# Patient Record
Sex: Male | Born: 1995 | Race: Black or African American | Hispanic: No | Marital: Single | Smoking: Never smoker
Health system: Southern US, Community
[De-identification: ages and names within clinical notes are randomized; demographics above are authoritative.]

## PROBLEM LIST (undated history)

## (undated) DIAGNOSIS — L309 Dermatitis, unspecified: Secondary | ICD-10-CM

## (undated) DIAGNOSIS — J45909 Unspecified asthma, uncomplicated: Secondary | ICD-10-CM

## (undated) DIAGNOSIS — J302 Other seasonal allergic rhinitis: Secondary | ICD-10-CM

## (undated) HISTORY — DX: Unspecified asthma, uncomplicated: J45.909

## (undated) HISTORY — PX: NO PAST SURGERIES: SHX2092

## (undated) HISTORY — DX: Dermatitis, unspecified: L30.9

---
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
## 2016-06-19 ENCOUNTER — Emergency Department (HOSPITAL_COMMUNITY)
Admission: EM | Admit: 2016-06-19 | Discharge: 2016-06-19 | Disposition: A | Discharge status: Home / Self Care | Payer: BLUE CROSS/BLUE SHIELD | Attending: Emergency Medicine | Admitting: Emergency Medicine

## 2016-06-19 ENCOUNTER — Encounter (HOSPITAL_COMMUNITY): Payer: Self-pay | Admitting: Emergency Medicine

## 2016-06-19 DIAGNOSIS — J039 Acute tonsillitis, unspecified: Secondary | ICD-10-CM | POA: Diagnosis not present

## 2016-06-19 DIAGNOSIS — J45909 Unspecified asthma, uncomplicated: Secondary | ICD-10-CM | POA: Diagnosis not present

## 2016-06-19 DIAGNOSIS — J029 Acute pharyngitis, unspecified: Secondary | ICD-10-CM | POA: Diagnosis present

## 2016-06-19 MED ORDER — AMOXICILLIN 500 MG PO CAPS: 500.0000 mg | ORAL_CAPSULE | Freq: Three times a day (TID) | ORAL | 0 refills | Status: DC

## 2016-06-19 NOTE — ED Triage Notes (Signed)
Patient c/o sore throat that started on Thursday and is progressively getting worse. Per patient had some abd pain and vomited x1 on Friday but denies any since. Denies any fevers or coughing. Per patient using cough drops to help with pain but no relief.

## 2016-06-19 NOTE — Discharge Instructions (Signed)
Return if any problems. See Dr. Gerda DissLuking for recheck if symptoms worsen or change.

## 2016-06-19 NOTE — ED Provider Notes (Signed)
[facility] Provider Note   CSN: [id number] Arrival date & time: 06/19/16  1538     History   Chief Complaint Chief Complaint  Patient presents with  . Sore Throat    HPI Scott PollockJohn H Orsak III is a 21 y.o. male.  The history is provided by the patient. No language interpreter was used.  Sore Throat  This is a new problem. The current episode started more than 1 week ago. The problem occurs constantly. The problem has been gradually worsening. Nothing aggravates the symptoms. Nothing relieves the symptoms. He has tried nothing for the symptoms. The treatment provided no relief.  Pt complains of a sore throat.  Pt reports tonsils are enlarged   Past Medical History:  Diagnosis Date  . Asthma   . Eczema   . Seasonal allergies     Patient Active Problem List   Diagnosis Date Noted  . Asthma 06/09/2014  . Allergic rhinitis 10/02/2012    History reviewed. No pertinent surgical history.     Home Medications    Prior to Admission medications   Medication Sig Start Date End Date Taking? Authorizing Provider  albuterol (PROVENTIL HFA;VENTOLIN HFA) 108 (90 BASE) MCG/ACT inhaler Inhale 2 puffs into the lungs every 6 (six) hours as needed for wheezing. 12/09/13   Babs SciaraLuking, Scott A, MD  albuterol (PROVENTIL) (2.5 MG/3ML) 0.083% nebulizer solution Take 3 mLs (2.5 mg total) by nebulization every 4 (four) hours as needed for wheezing. 12/09/13   Babs SciaraLuking, Scott A, MD  amoxicillin (AMOXIL) 500 MG capsule Take 1 capsule (500 mg total) by mouth 3 (three) times daily. 06/19/16   Elson AreasSofia, Leslie K, PA-C  azithromycin (ZITHROMAX Z-PAK) 250 MG tablet Take 2 tablets (500 mg) on  Day 1,  followed by 1 tablet (250 mg) once daily on Days 2 through 5. 07/06/15   Campbell RichesHoskins, Carolyn C, NP  cetirizine (ZYRTEC) 10 MG tablet Take 1 tablet (10 mg total) by mouth daily. 12/20/13   Babs SciaraLuking, Scott A, MD  fluticasone Aleda Grana(FLONASE) 50 MCG/ACT nasal spray instill 2 sprays into each nostril once daily 03/09/15   Campbell RichesHoskins,  Carolyn C, NP    Family History History reviewed. No pertinent family history.  Social History Social History  Substance Use Topics  . Smoking status: Never Smoker  . Smokeless tobacco: Never Used  . Alcohol use No     Allergies   Patient has no known allergies.   Review of Systems Review of Systems  All other systems reviewed and are negative.    Physical Exam Updated Vital Signs BP (!) 147/83 (BP Location: Right Arm)   Pulse 96   Temp 99.3 F (37.4 C) (Oral)   Resp 18   Ht 5\' 10"  (1.778 m)   Wt 85.7 kg (189 lb)   SpO2 100%   BMI 27.12 kg/m   Physical Exam  Constitutional: He appears well-developed and well-nourished.  HENT:  Head: Normocephalic and atraumatic.  Nose: Nose normal.  Enlarged tonsils, erythema throat,   Eyes: Conjunctivae are normal.  Neck: Neck supple.  Cardiovascular: Normal rate and regular rhythm.   No murmur heard. Pulmonary/Chest: Effort normal and breath sounds normal. No respiratory distress.  Abdominal: Soft. There is no tenderness.  Musculoskeletal: Normal range of motion. He exhibits no edema.  Neurological: He is alert.  Skin: Skin is warm and dry.  Psychiatric: He has a normal mood and affect.  Nursing note and vitals reviewed.    ED Treatments / Results  Labs (all labs ordered are  listed, but only abnormal results are displayed) Labs Reviewed - No data to display  EKG  EKG Interpretation None       Radiology No results found.  Procedures Procedures (including critical care time)  Medications Ordered in ED Medications - No data to display   Initial Impression / Assessment and Plan / ED Course  I have reviewed the triage vital signs and the nursing notes.  Pertinent labs & imaging results that were available during my care of the patient were reviewed by me and considered in my medical decision making (see chart for details).       Final Clinical Impressions(s) / ED Diagnoses   Final diagnoses:    Tonsillitis    New Prescriptions New Prescriptions   AMOXICILLIN (AMOXIL) 500 MG CAPSULE    Take 1 capsule (500 mg total) by mouth 3 (three) times daily.  An After Visit Summary was printed and given to the patient.   Elson Areas, [place] 06/19/16 1740    Eber Hong, MD 06/21/16 1004

## 2016-06-20 ENCOUNTER — Encounter: Payer: Self-pay | Admitting: Family Medicine

## 2016-06-20 ENCOUNTER — Ambulatory Visit (INDEPENDENT_AMBULATORY_CARE_PROVIDER_SITE_OTHER): Payer: BLUE CROSS/BLUE SHIELD | Admitting: Family Medicine

## 2016-06-20 VITALS — BP 120/84 | Temp 99.0°F | Ht 70.75 in | Wt 197.0 lb

## 2016-06-20 DIAGNOSIS — I889 Nonspecific lymphadenitis, unspecified: Secondary | ICD-10-CM

## 2016-06-20 DIAGNOSIS — J329 Chronic sinusitis, unspecified: Secondary | ICD-10-CM | POA: Diagnosis not present

## 2016-06-20 DIAGNOSIS — J029 Acute pharyngitis, unspecified: Secondary | ICD-10-CM | POA: Diagnosis not present

## 2016-06-20 NOTE — Progress Notes (Signed)
   Subjective:    Patient ID: Scott Lam, male    DOB: 03-Dec-1995, 21 y.o.   MRN: [id number]  Dizziness  This is a new problem. The current episode started in the past 7 days. Associated symptoms include coughing and a sore throat. Associated symptoms comments: Runny nose . Treatments tried: Amoxicillin    Patient seen at ER yesterday and was diagnosed with Tonsilitis  Last wk glands of throat swolen and inflammed  Then vom and felt bad  Felt bad diminished enrgy  Tonsillitis Gland now a little better  somehat better  Missed work    Review of Systems  HENT: Positive for sore throat.   Respiratory: Positive for cough.   Neurological: Positive for dizziness.       Objective:   Physical Exam  Alert, mild malaise. Hydration good Vitals stable. frontal/ maxillary tenderness evident positive nasal congestion. pharynx normal neck supple  lungs clear/no crackles or wheezes. heart regular in rhythm       Assessment & Plan:  Impression rhinosinusitis likely post viral, discussed with patient. plan antibiotics prescribed. Questions answered. Symptomatic care discussed. warning signs discussed. WSL

## 2016-10-10 ENCOUNTER — Encounter: Payer: Self-pay | Admitting: Family Medicine

## 2016-10-10 ENCOUNTER — Ambulatory Visit (INDEPENDENT_AMBULATORY_CARE_PROVIDER_SITE_OTHER): Payer: BLUE CROSS/BLUE SHIELD | Admitting: Family Medicine

## 2016-10-10 VITALS — BP 114/74 | Temp 99.0°F | Wt 199.2 lb

## 2016-10-10 DIAGNOSIS — J029 Acute pharyngitis, unspecified: Secondary | ICD-10-CM | POA: Diagnosis not present

## 2016-10-10 DIAGNOSIS — I889 Nonspecific lymphadenitis, unspecified: Secondary | ICD-10-CM | POA: Diagnosis not present

## 2016-10-10 LAB — POCT RAPID STREP A (OFFICE): RAPID STREP A SCREEN: NEGATIVE

## 2016-10-10 MED ORDER — AZITHROMYCIN 250 MG PO TABS: ORAL_TABLET | ORAL | 0 refills | Status: DC

## 2016-10-10 NOTE — Progress Notes (Signed)
   Subjective:    Patient ID: Scott Lam, male    DOB: 1995/10/05, 21 y.o.   MRN: [id number]  Sore Throat   This is a new problem. The current episode started in the past 7 days. The maximum temperature recorded prior to his arrival was 100.4 - 100.9 F. Associated symptoms include headaches. Pertinent negatives include no congestion, coughing or ear pain. Treatments tried: Aleve    Significant sore throat for several days inflammation enlargement of tonsils with exudate in addition the medicine fever headache fatigue tiredness some chills no wheezing a little bit of runny nose but no significant congestion or cough Patient states no other concerns this visit.  Results for orders placed or performed in visit on 10/10/16  POCT rapid strep A  Result Value Ref Range   Rapid Strep A Screen Negative Negative    Review of Systems  Constitutional: Negative for activity change and fever.  HENT: Positive for sore throat. Negative for congestion, ear pain and rhinorrhea.   Eyes: Negative for discharge.  Respiratory: Negative for cough and wheezing.   Cardiovascular: Negative for chest pain.  Neurological: Positive for headaches.       Objective:   Physical Exam  Constitutional: He appears well-developed.  HENT:  Head: Normocephalic.  Mouth/Throat: Oropharyngeal exudate present.  Neck: Normal range of motion.  Cardiovascular: Normal rate, regular rhythm and normal heart sounds.   No murmur heard. Pulmonary/Chest: Effort normal and breath sounds normal. He has no wheezes.  Lymphadenopathy:    He has no cervical adenopathy.  Neurological: He exhibits normal muscle tone.  Skin: Skin is warm and dry.  Nursing note and vitals reviewed.  Significant erythema of both tonsils with enlargement but no sign of abscess Rapid strep negative      Assessment & Plan:  Significant pharyngitis Strep test negative Could be mycoplasma could be viral Z-Pak recommended Warning signs were  discussed If not getting better over the next several days notify us we may need to do lab work to look for mono No sign of abscess

## 2016-10-11 LAB — STREP A DNA PROBE: Strep Gp A Direct, DNA Probe: NEGATIVE

## 2018-02-20 ENCOUNTER — Ambulatory Visit: Payer: Self-pay | Admitting: Family Medicine

## 2018-02-20 ENCOUNTER — Encounter: Payer: Self-pay | Admitting: Family Medicine

## 2018-02-20 VITALS — BP 128/74 | Temp 98.5°F | Ht 70.75 in | Wt 199.4 lb

## 2018-02-20 DIAGNOSIS — H6123 Impacted cerumen, bilateral: Secondary | ICD-10-CM

## 2018-02-20 NOTE — Progress Notes (Signed)
Subjective:    Patient ID: Scott Lam, male    DOB: 12-Aug-1995, 23 y.o.   MRN: [id number]  HPI Ears stopped up for about a year. Trouble hearing for about one week. Tried an otc ear wax removal kit, was able to get some wax out but feels like it's getting worse over the last 2-3 days. Works around [employer], wears ear plugs at work.   No recent URI symptoms.   Review of Systems  Constitutional: Negative for fever.  HENT: Negative for congestion, rhinorrhea, sinus pressure and sore throat.   Eyes: Negative for discharge.  Respiratory: Negative for cough, shortness of breath and wheezing.        Objective:   Physical Exam Vitals signs and nursing note reviewed.  Constitutional:      General: He is not in acute distress.    Appearance: Normal appearance. He is not toxic-appearing.  HENT:     Head: Normocephalic and atraumatic.     Right Ear: There is impacted cerumen.     Left Ear: There is impacted cerumen.     Nose: Nose normal.     Mouth/Throat:     Mouth: Mucous membranes are moist.     Pharynx: Oropharynx is clear.  Eyes:     General:        Right eye: No discharge.        Left eye: No discharge.  Neck:     Musculoskeletal: Neck supple. No neck rigidity.  Cardiovascular:     Rate and Rhythm: Normal rate and regular rhythm.     Heart sounds: Normal heart sounds.  Pulmonary:     Effort: Pulmonary effort is normal. No respiratory distress.     Breath sounds: Normal breath sounds.  Lymphadenopathy:     Cervical: No cervical adenopathy.  Skin:    General: Skin is warm and dry.  Neurological:     Mental Status: He is alert and oriented to person, place, and time.  Psychiatric:        Mood and Affect: Mood normal.        Assessment & Plan:  Bilateral impacted cerumen - Plan: Ambulatory referral to ENT  Recommend referral to ENT for bilateral cerumen removal. Also recommended scheduling a wellness exam with Dr. Nicki Reaper at pt's earliest convenience as  we've not seen him for wellness is quite some time.

## 2018-02-28 ENCOUNTER — Encounter: Payer: Self-pay | Admitting: Family Medicine

## 2018-04-05 ENCOUNTER — Ambulatory Visit (INDEPENDENT_AMBULATORY_CARE_PROVIDER_SITE_OTHER): Payer: BLUE CROSS/BLUE SHIELD | Admitting: Otolaryngology

## 2018-04-05 DIAGNOSIS — H6123 Impacted cerumen, bilateral: Secondary | ICD-10-CM

## 2018-04-05 DIAGNOSIS — H900 Conductive hearing loss, bilateral: Secondary | ICD-10-CM

## 2018-11-30 ENCOUNTER — Other Ambulatory Visit: Payer: Self-pay

## 2018-11-30 DIAGNOSIS — Z20822 Contact with and (suspected) exposure to covid-19: Secondary | ICD-10-CM

## 2018-12-01 LAB — NOVEL CORONAVIRUS, NAA: SARS-CoV-2, NAA: NOT DETECTED

## 2018-12-06 ENCOUNTER — Telehealth: Payer: Self-pay | Admitting: *Deleted

## 2018-12-06 NOTE — Telephone Encounter (Signed)
Pt notified that his covid-19 test result is negative. He voiced understanding. He had been around a Art therapist that tested positive.

## 2019-02-16 ENCOUNTER — Ambulatory Visit (INDEPENDENT_AMBULATORY_CARE_PROVIDER_SITE_OTHER): Payer: Self-pay

## 2019-02-16 ENCOUNTER — Ambulatory Visit
Admission: EM | Admit: 2019-02-16 | Discharge: 2019-02-16 | Disposition: A | Discharge status: Home / Self Care | Payer: Self-pay | Attending: Emergency Medicine | Admitting: Emergency Medicine

## 2019-02-16 ENCOUNTER — Other Ambulatory Visit: Payer: Self-pay

## 2019-02-16 DIAGNOSIS — S6992XA Unspecified injury of left wrist, hand and finger(s), initial encounter: Secondary | ICD-10-CM

## 2019-02-16 DIAGNOSIS — M25532 Pain in left wrist: Secondary | ICD-10-CM

## 2019-02-16 NOTE — Discharge Instructions (Addendum)
Rest, ice and heat as needed Ensure adequate ROM as tolerated. Advised to get a wrist splint at the store Take OTC Tylenol ibuprofen for pain management Return here or go to ER if you have any new or worsening symptoms such as numbness/tingling of the inner thighs, loss of bladder or bowel control, headache/blurry vision, nausea/vomiting, confusion/altered mental status, dizziness, weakness, passing out, imbalance, etc..Marland Kitchen

## 2019-02-16 NOTE — ED Triage Notes (Signed)
Patient states that he was moving something heavy and felt something pop in his left wrist x 2 days ago. Pain along medial side. No bruising or swelling. FROM present in left wrist.

## 2019-02-16 NOTE — ED Provider Notes (Signed)
[facility]    CSN: [id number] Arrival date & time: 02/16/19  1232      History   Chief Complaint Chief Complaint  Patient presents with  . Wrist Injury    HPI Scott Lam is a 24 y.o. male.   Scott Lam 24 years old male present to the urgent care with a complaint of left wrist pain  for the past 2 days.  Reports he heard something pop in his wrist.  He localizes the pain to the left wrist.  He describes the pain as constant and achy and rated at 4 on a scale of 1-10.  Pain is worse on range of motion.  Report he has not tried any medication.  His symptoms are made worse with movement and lifting heavy objects.  He denies similar symptoms in the past.     The history is provided by the patient. No language interpreter was used.  Wrist Injury   Past Medical History:  Diagnosis Date  . Asthma   . Eczema   . Seasonal allergies     Patient Active Problem List   Diagnosis Date Noted  . Asthma 06/09/2014  . Allergic rhinitis 10/02/2012    History reviewed. No pertinent surgical history.     Home Medications    Prior to Admission medications   Not on File    Family History Family History  Problem Relation Age of Onset  . Healthy Mother   . Healthy Father     Social History Social History   Tobacco Use  . Smoking status: Never Smoker  . Smokeless tobacco: Never Used  Substance Use Topics  . Alcohol use: No  . Drug use: No     Allergies   Patient has no known allergies.   Review of Systems Review of Systems  Constitutional: Negative.   Respiratory: Negative.   Cardiovascular: Negative.   Musculoskeletal: Negative for joint swelling.       Wrist pain  Neurological: Negative.   All other systems reviewed and are negative.    Physical Exam Triage Vital Signs ED Triage Vitals  Enc Vitals Group     BP 02/16/19 1239 126/77     Pulse Rate 02/16/19 1239 62     Resp 02/16/19 1239 16     Temp 02/16/19 1239 98.4 F (36.9 C)     Temp Source 02/16/19 1239 Oral     SpO2 02/16/19 1239 99 %     Weight --      Height --      Head Circumference --      Peak Flow --      Pain Score 02/16/19 1244 6     Pain Loc --      Pain Edu? --      Excl. in [place]? --    No data found.  Updated Vital Signs BP 126/77 (BP Location: Right Arm)   Pulse 62   Temp 98.4 F (36.9 C) (Oral)   Resp 16   SpO2 99%   Visual Acuity Right Eye Distance:   Left Eye Distance:   Bilateral Distance:    Right Eye Near:   Left Eye Near:    Bilateral Near:     Physical Exam Vitals and nursing note reviewed.  Constitutional:      General: He is not in acute distress.    Appearance: Normal appearance. He is normal weight. He is not toxic-appearing.  Cardiovascular:     Rate and Rhythm:  Normal rate and regular rhythm.     Pulses: Normal pulses.     Heart sounds: Normal heart sounds. No murmur.  Pulmonary:     Effort: Pulmonary effort is normal. No respiratory distress.     Breath sounds: Normal breath sounds. No wheezing.  Chest:     Chest wall: No tenderness.  Musculoskeletal:        General: Tenderness present. No swelling, deformity or signs of injury. Normal range of motion.     Right wrist: Normal.     Left wrist: No swelling, deformity, effusion, lacerations or tenderness. Normal range of motion. Normal pulse.     Right lower leg: No edema.     Left lower leg: No edema.  Neurological:     General: No focal deficit present.     Mental Status: He is alert and oriented to person, place, and time.     Cranial Nerves: No cranial nerve deficit.     Sensory: No sensory deficit.     Motor: No weakness.     Coordination: Coordination normal.     Gait: Gait normal.     Deep Tendon Reflexes: Reflexes normal.      UC Treatments / Results  Labs (all labs ordered are listed, but only abnormal results are displayed) Labs Reviewed - No data to display  EKG   Radiology DG Wrist Complete Left  Result Date: 02/16/2019 CLINICAL  DATA:  Trauma 2 days ago.  Pain. EXAM: LEFT WRIST - COMPLETE 3+ VIEW COMPARISON:  None. FINDINGS: There is no evidence of fracture or dislocation. There is no evidence of arthropathy or other focal bone abnormality. Soft tissues are unremarkable. IMPRESSION: Negative. Electronically Signed   By: Gerome Sam Lam M.D   On: 02/16/2019 13:08    Procedures Procedures (including critical care time)  Medications Ordered in UC Medications - No data to display  Initial Impression / Assessment and Plan / UC Course  I have reviewed the triage vital signs and the nursing notes.  Pertinent labs & imaging results that were available during my care of the patient were reviewed by me and considered in my medical decision making (see chart for details).    Left wrist x-ray was was ordered and result was reviewed.  Result is negative for acute fracture or dislocation. Patient is stable for discharge.   Advised patient to follow up with primary care To return for worsening of symptoms  Final Clinical Impressions(s) / UC Diagnoses   Final diagnoses:  Left wrist pain     Discharge Instructions     Rest, ice and heat as needed Ensure adequate ROM as tolerated. Advised to get a wrist splint at the store Take OTC Tylenol ibuprofen for pain management Return here or go to ER if you have any new or worsening symptoms such as numbness/tingling of the inner thighs, loss of bladder or bowel control, headache/blurry vision, nausea/vomiting, confusion/altered mental status, dizziness, weakness, passing out, imbalance, etc...      ED Prescriptions    None     PDMP not reviewed this encounter.   Durward Parcel, FNP 02/16/19 1328

## 2019-09-25 ENCOUNTER — Ambulatory Visit
Admission: EM | Admit: 2019-09-25 | Discharge: 2019-09-25 | Disposition: A | Discharge status: Home / Self Care | Payer: Self-pay | Attending: Emergency Medicine | Admitting: Emergency Medicine

## 2019-09-25 ENCOUNTER — Encounter: Payer: Self-pay | Admitting: Emergency Medicine

## 2019-09-25 ENCOUNTER — Other Ambulatory Visit: Payer: Self-pay

## 2019-09-25 DIAGNOSIS — H6123 Impacted cerumen, bilateral: Secondary | ICD-10-CM

## 2019-09-25 MED ORDER — AMOXICILLIN 500 MG PO CAPS: 500.0000 mg | ORAL_CAPSULE | Freq: Two times a day (BID) | ORAL | 0 refills | Status: AC

## 2019-09-25 NOTE — ED Triage Notes (Signed)
Bilateral ears stopped up. Pt has hx of wax buildup

## 2019-09-25 NOTE — Discharge Instructions (Signed)
Wax removed Amoxicillin x 1 week Ibuprofen and tylenol as needed

## 2019-09-25 NOTE — ED Provider Notes (Signed)
[facility]    CSN: [id number] Arrival date & time: 09/25/19  1010      History   Chief Complaint Chief Complaint  Patient presents with  . Cerumen Impaction    HPI Scott Lam is a 24 y.o. male presenting today for evaluation of cerumen impaction.  Reports that has developed discomfort in bilateral ears.  Has history of wax buildup and feels similar.  Denies any associated pain.  Denies recent URI symptoms.  Applied some Debrox last night.  HPI  Past Medical History:  Diagnosis Date  . Asthma   . Eczema   . Seasonal allergies     Patient Active Problem List   Diagnosis Date Noted  . Asthma 06/09/2014  . Allergic rhinitis 10/02/2012    History reviewed. No pertinent surgical history.     Home Medications    Prior to Admission medications   Medication Sig Start Date End Date Taking? Authorizing Provider  amoxicillin (AMOXIL) 500 MG capsule Take 1 capsule (500 mg total) by mouth 2 (two) times daily for 7 days. 09/25/19 10/02/19  Deklyn Trachtenberg, Junius Creamer, PA-C    Family History Family History  Problem Relation Age of Onset  . Healthy Mother   . Healthy Father     Social History Social History   Tobacco Use  . Smoking status: Never Smoker  . Smokeless tobacco: Never Used  Vaping Use  . Vaping Use: Never used  Substance Use Topics  . Alcohol use: No  . Drug use: No     Allergies   Patient has no known allergies.   Review of Systems Review of Systems  Constitutional: Negative for activity change, appetite change, chills, fatigue and fever.  HENT: Positive for hearing loss. Negative for congestion, ear pain, rhinorrhea, sinus pressure, sore throat and trouble swallowing.   Eyes: Negative for discharge and redness.  Respiratory: Negative for cough, chest tightness and shortness of breath.   Cardiovascular: Negative for chest pain.  Gastrointestinal: Negative for abdominal pain, diarrhea, nausea and vomiting.  Musculoskeletal: Negative for  myalgias.  Skin: Negative for rash.  Neurological: Negative for dizziness, light-headedness and headaches.     Physical Exam Triage Vital Signs ED Triage Vitals  Enc Vitals Group     BP      Pulse      Resp      Temp      Temp src      SpO2      Weight      Height      Head Circumference      Peak Flow      Pain Score      Pain Loc      Pain Edu?      Excl. in [place]?    No data found.  Updated Vital Signs BP 121/75 (BP Location: Right Arm)   Pulse 62   Temp 98.7 F (37.1 C) (Oral)   Resp 17   Ht 5\' 11"  (1.803 m)   Wt 198 lb (89.8 kg)   SpO2 98%   BMI 27.62 kg/m   Visual Acuity Right Eye Distance:   Left Eye Distance:   Bilateral Distance:    Right Eye Near:   Left Eye Near:    Bilateral Near:     Physical Exam Vitals and nursing note reviewed.  Constitutional:      Appearance: He is well-developed.     Comments: No acute distress  HENT:     Head: Normocephalic  and atraumatic.     Ears:     Comments: bialteral cerumen impation    Nose: Nose normal.     Comments: Bilateral turbinates swollen and pink    Mouth/Throat:     Comments: Oral mucosa pink and moist, no tonsillar enlargement or exudate. Posterior pharynx patent and nonerythematous, no uvula deviation or swelling. Normal phonation. Eyes:     Conjunctiva/sclera: Conjunctivae normal.  Cardiovascular:     Rate and Rhythm: Normal rate.  Pulmonary:     Effort: Pulmonary effort is normal. No respiratory distress.  Abdominal:     General: There is no distension.  Musculoskeletal:        General: Normal range of motion.     Cervical back: Neck supple.  Skin:    General: Skin is warm and dry.  Neurological:     Mental Status: He is alert and oriented to person, place, and time.      UC Treatments / Results  Labs (all labs ordered are listed, but only abnormal results are displayed) Labs Reviewed - No data to display  EKG   Radiology No results found.  Procedures Procedures  (including critical care time)  Medications Ordered in UC Medications - No data to display  Initial Impression / Assessment and Plan / UC Course  I have reviewed the triage vital signs and the nursing notes.  Pertinent labs & imaging results that were available during my care of the patient were reviewed by me and considered in my medical decision making (see chart for details).     Ear irrigation by nursing staff performed.  Canals examined after removal and TMs intact, appears slightly dull, canals erythematous without swelling.  Providing amoxicillin for prevention of infection, Tylenol and ibuprofen for inflammation.  Debrox for prevention.  Discussed strict return precautions. Patient verbalized understanding and is agreeable with plan.   Final Clinical Impressions(s) / UC Diagnoses   Final diagnoses:  Bilateral impacted cerumen     Discharge Instructions     Wax removed Amoxicillin x 1 week Ibuprofen and tylenol as needed    ED Prescriptions    Medication Sig Dispense Auth. Provider   amoxicillin (AMOXIL) 500 MG capsule Take 1 capsule (500 mg total) by mouth 2 (two) times daily for 7 days. 14 capsule Alexea Blase, [place] C, PA-C     PDMP not reviewed this encounter.   Lew Dawes, [place] 09/25/19 1115

## 2020-05-28 ENCOUNTER — Ambulatory Visit (INDEPENDENT_AMBULATORY_CARE_PROVIDER_SITE_OTHER): Payer: BC Managed Care – PPO | Admitting: Family Medicine

## 2020-05-28 ENCOUNTER — Encounter: Payer: Self-pay | Admitting: Family Medicine

## 2020-05-28 ENCOUNTER — Other Ambulatory Visit: Payer: Self-pay

## 2020-05-28 VITALS — BP 140/82 | HR 86 | Temp 97.5°F | Wt 240.0 lb

## 2020-05-28 DIAGNOSIS — J029 Acute pharyngitis, unspecified: Secondary | ICD-10-CM | POA: Diagnosis not present

## 2020-05-28 DIAGNOSIS — J020 Streptococcal pharyngitis: Secondary | ICD-10-CM | POA: Diagnosis not present

## 2020-05-28 LAB — POCT RAPID STREP A (OFFICE): Rapid Strep A Screen: POSITIVE — AB

## 2020-05-28 MED ORDER — PENICILLIN V POTASSIUM 500 MG PO TABS: 500.0000 mg | ORAL_TABLET | Freq: Three times a day (TID) | ORAL | 0 refills | Status: AC

## 2020-05-28 NOTE — Patient Instructions (Addendum)
https://www.nhlbi.nih.gov/files/docs/public/heart/dash_brief.pdf">  DASH Eating Plan DASH stands for Dietary Approaches to Stop Hypertension. The DASH eating plan is a healthy eating plan that has been shown to:  Reduce high blood pressure (hypertension).  Reduce your risk for type 2 diabetes, heart disease, and stroke.  Help with weight loss. What are tips for following this plan? Reading food labels  Check food labels for the amount of salt (sodium) per serving. Choose foods with less than 5 percent of the Daily Value of sodium. Generally, foods with less than 300 milligrams (mg) of sodium per serving fit into this eating plan.  To find whole grains, look for the word "whole" as the first word in the ingredient list. Shopping  Buy products labeled as "low-sodium" or "no salt added."  Buy fresh foods. Avoid canned foods and pre-made or frozen meals. Cooking  Avoid adding salt when cooking. Use salt-free seasonings or herbs instead of table salt or sea salt. Check with your health care provider or pharmacist before using salt substitutes.  Do not fry foods. Cook foods using healthy methods such as baking, boiling, grilling, roasting, and broiling instead.  Cook with heart-healthy oils, such as olive, canola, avocado, soybean, or sunflower oil. Meal planning  Eat a balanced diet that includes: ? 4 or more servings of fruits and 4 or more servings of vegetables each day. Try to fill one-half of your plate with fruits and vegetables. ? 6-8 servings of whole grains each day. ? Less than 6 oz (170 g) of lean meat, poultry, or fish each day. A 3-oz (85-g) serving of meat is about the same size as a deck of cards. One egg equals 1 oz (28 g). ? 2-3 servings of low-fat dairy each day. One serving is 1 cup (237 mL). ? 1 serving of nuts, seeds, or beans 5 times each week. ? 2-3 servings of heart-healthy fats. Healthy fats called omega-3 fatty acids are found in foods such as walnuts,  flaxseeds, fortified milks, and eggs. These fats are also found in cold-water fish, such as sardines, salmon, and mackerel.  Limit how much you eat of: ? Canned or prepackaged foods. ? Food that is high in trans fat, such as some fried foods. ? Food that is high in saturated fat, such as fatty meat. ? Desserts and other sweets, sugary drinks, and other foods with added sugar. ? Full-fat dairy products.  Do not salt foods before eating.  Do not eat more than 4 egg yolks a week.  Try to eat at least 2 vegetarian meals a week.  Eat more home-cooked food and less restaurant, buffet, and fast food.   Lifestyle  When eating at a restaurant, ask that your food be prepared with less salt or no salt, if possible.  If you drink alcohol: ? Limit how much you use to:  0-1 drink a day for women who are not pregnant.  0-2 drinks a day for men. ? Be aware of how much alcohol is in your drink. In the [place] one drink equals one 12 oz bottle of beer (355 mL), one 5 oz glass of wine (148 mL), or one 1 oz glass of hard liquor (44 mL). General information  Avoid eating more than 2,300 mg of salt a day. If you have hypertension, you may need to reduce your sodium intake to 1,500 mg a day.  Work with your health care provider to maintain a healthy body weight or to lose weight. Ask what an ideal weight is for   you.  Get at least 30 minutes of exercise that causes your heart to beat faster (aerobic exercise) most days of the week. Activities may include walking, swimming, or biking.  Work with your health care provider or dietitian to adjust your eating plan to your individual calorie needs. What foods should I eat? Fruits All fresh, dried, or frozen fruit. Canned fruit in natural juice (without added sugar). Vegetables Fresh or frozen vegetables (raw, steamed, roasted, or grilled). Low-sodium or reduced-sodium tomato and vegetable juice. Low-sodium or reduced-sodium tomato sauce and tomato paste.  Low-sodium or reduced-sodium canned vegetables. Grains Whole-grain or whole-wheat bread. Whole-grain or whole-wheat pasta. Brown rice. Oatmeal. Quinoa. Bulgur. Whole-grain and low-sodium cereals. Pita bread. Low-fat, low-sodium crackers. Whole-wheat flour tortillas. Meats and other proteins Skinless chicken or turkey. Ground chicken or turkey. Pork with fat trimmed off. Fish and seafood. Egg whites. Dried beans, peas, or lentils. Unsalted nuts, nut butters, and seeds. Unsalted canned beans. Lean cuts of beef with fat trimmed off. Low-sodium, lean precooked or cured meat, such as sausages or meat loaves. Dairy Low-fat (1%) or fat-free (skim) milk. Reduced-fat, low-fat, or fat-free cheeses. Nonfat, low-sodium ricotta or cottage cheese. Low-fat or nonfat yogurt. Low-fat, low-sodium cheese. Fats and oils Soft margarine without trans fats. Vegetable oil. Reduced-fat, low-fat, or light mayonnaise and salad dressings (reduced-sodium). Canola, safflower, olive, avocado, soybean, and sunflower oils. Avocado. Seasonings and condiments Herbs. Spices. Seasoning mixes without salt. Other foods Unsalted popcorn and pretzels. Fat-free sweets. The items listed above may not be a complete list of foods and beverages you can eat. Contact a dietitian for more information. What foods should I avoid? Fruits Canned fruit in a light or heavy syrup. Fried fruit. Fruit in cream or butter sauce. Vegetables Creamed or fried vegetables. Vegetables in a cheese sauce. Regular canned vegetables (not low-sodium or reduced-sodium). Regular canned tomato sauce and paste (not low-sodium or reduced-sodium). Regular tomato and vegetable juice (not low-sodium or reduced-sodium). Pickles. Olives. Grains Baked goods made with fat, such as croissants, muffins, or some breads. Dry pasta or rice meal packs. Meats and other proteins Fatty cuts of meat. Ribs. Fried meat. Bacon. Bologna, salami, and other precooked or cured meats, such as  sausages or meat loaves. Fat from the back of a pig (fatback). Bratwurst. Salted nuts and seeds. Canned beans with added salt. Canned or smoked fish. Whole eggs or egg yolks. Chicken or turkey with skin. Dairy Whole or 2% milk, cream, and half-and-half. Whole or full-fat cream cheese. Whole-fat or sweetened yogurt. Full-fat cheese. Nondairy creamers. Whipped toppings. Processed cheese and cheese spreads. Fats and oils Butter. Stick margarine. Lard. Shortening. Ghee. Bacon fat. Tropical oils, such as coconut, palm kernel, or palm oil. Seasonings and condiments Onion salt, garlic salt, seasoned salt, table salt, and sea salt. Worcestershire sauce. Tartar sauce. Barbecue sauce. Teriyaki sauce. Soy sauce, including reduced-sodium. Steak sauce. Canned and packaged gravies. Fish sauce. Oyster sauce. Cocktail sauce. Store-bought horseradish. Ketchup. Mustard. Meat flavorings and tenderizers. Bouillon cubes. Hot sauces. Pre-made or packaged marinades. Pre-made or packaged taco seasonings. Relishes. Regular salad dressings. Other foods Salted popcorn and pretzels. The items listed above may not be a complete list of foods and beverages you should avoid. Contact a dietitian for more information. Where to find more information  National Heart, Lung, and Blood Institute: www.nhlbi.nih.gov  American Heart Association: www.heart.org  Academy of Nutrition and Dietetics: www.eatright.org  National Kidney Foundation: www.kidney.org Summary  The DASH eating plan is a healthy eating plan that has been shown to   reduce high blood pressure (hypertension). It may also reduce your risk for type 2 diabetes, heart disease, and stroke.  When on the DASH eating plan, aim to eat more fresh fruits and vegetables, whole grains, lean proteins, low-fat dairy, and heart-healthy fats.  With the DASH eating plan, you should limit salt (sodium) intake to 2,300 mg a day. If you have hypertension, you may need to reduce your  sodium intake to 1,500 mg a day.  Work with your health care provider or dietitian to adjust your eating plan to your individual calorie needs. This information is not intended to replace advice given to you by your health care provider. Make sure you discuss any questions you have with your health care provider. Document Revised: 12/07/2018 Document Reviewed: 12/07/2018 Elsevier Patient Education  2021 [employer]  Strep Throat, Adult Strep throat is an infection of the throat. It is caused by germs (bacteria). Strep throat is common during the cold months of the year. It mostly affects children who are 37-44 years old. However, people of all ages can get it at any time of the year. When strep throat affects the tonsils, it is called tonsillitis. When it affects the back of the throat, it is called pharyngitis. This infection spreads from person to person through coughing, sneezing, or having close contact. What are the causes? This condition is caused by the Streptococcus pyogenes germ. What increases the risk? You are more likely to develop this condition if:  You care for young children. Children are more likely to get strep throat and may spread it to others.  You go to crowded places. Germs can spread easily in such places.  You kiss or touch someone who has strep throat. What are the signs or symptoms? Symptoms of this condition include:  Fever or chills.  Redness, swelling, or pain in the tonsils or throat.  Pain or trouble when swallowing.  White or yellow spots on the tonsils or throat.  Tender glands in the neck and under the jaw.  Bad breath.  Red rash all over the body. This is rare. How is this treated? This condition may be treated with:  Medicines that kill germs (antibiotics).  Medicines that treat pain or fever. These include: ? Ibuprofen or acetaminophen. ? Aspirin, only for patients who are over the age of 7. ? Throat lozenges. ? Throat  sprays. Follow these instructions at home: Medicines  Take over-the-counter and prescription medicines only as told by your doctor.  Take your antibiotic medicine as told by your doctor. Do not stop taking the antibiotic even if you start to feel better.   Eating and drinking  If you have trouble swallowing, eat soft foods until your throat feels better.  Drink enough fluid to keep your pee (urine) pale yellow.  To help with pain, you may have: ? Warm fluids, such as soup and tea. ? Cold fluids, such as frozen desserts or popsicles.   General instructions  Rinse your mouth (gargle) with a salt-water mixture 3-4 times a day or as needed. To make a salt-water mixture, dissolve -1 tsp (3-6 g) of salt in 1 cup (237 mL) of warm water.  Rest as much as you can.  Stay home from work or school until you have been taking antibiotics for 24 hours.  Avoid smoking or being around people who smoke.  Keep all follow-up visits as told by your doctor. This is important. How is this prevented?  Do not share food, drinking cups,  or personal items. They can cause the germs to spread.  Wash your hands well with soap and water. Make sure that all people in your house wash their hands well.  Have family members tested if they have a fever or a sore throat. They may need an antibiotic if they have strep throat.   Contact a doctor if:  You have swelling in your neck that keeps getting bigger.  You get a rash, cough, or earache.  You cough up a thick fluid that is green, yellow-brown, or bloody.  You have pain that does not get better with medicine.  Your symptoms get worse instead of getting better.  You have a fever. Get help right away if:  You vomit.  You have a very bad headache.  Your neck hurts or feels stiff.  You have chest pain or are short of breath.  You have drooling, very bad throat pain, or changes in your voice.  Your neck is swollen, or the skin gets red and  tender.  Your mouth is dry, or you are peeing less than normal.  You keep feeling more tired or have trouble waking up.  Your joints are red or painful. Summary  Strep throat is an infection of the throat. It is caused by germs (bacteria).  This infection can spread from person to person through coughing, sneezing, or having close contact.  Take your medicines, including antibiotics, as told by your doctor. Do not stop taking the antibiotic even if you start to feel better.  To prevent the spread of germs, wash your hands well with soap and water. Have others do the same. Do not share food, drinking cups, or personal items.  Get help right away if you have a bad headache, chest pain, shortness of breath, a stiff or painful neck, or you vomit. This information is not intended to replace advice given to you by your health care provider. Make sure you discuss any questions you have with your health care provider. Document Revised: 03/23/2018 Document Reviewed: 03/23/2018 Elsevier Patient Education  2021 [employer].

## 2020-05-28 NOTE — Progress Notes (Signed)
Patient ID: Scott Lam, male    DOB: 1995-10-30, 25 y.o.   MRN: [id number]   Chief Complaint  Patient presents with  . Sore Throat   Subjective:  CC: sore throat and congestion  This is a new problem.  Presents today for an acute visit with complaint of sore throat and congestion.  Symptoms have been present since Tuesday.  Has some leftover amoxicillin, has taken 2 doses yesterday, and also Advil cold and flu.  Has a history of strep throat, feels like this is strep throat.  Denies fever, chills.  Endorses appetite change, fatigue, congestion, postnasal drip, sinus pain and pressure, sore throat, and cough.   Pt having sore throat and congestion for a few days. Pt has taken Amoxicillin and Advill Cold and Flu. No fever, no cough, no headache.    Medical History Scott Lam has a past medical history of Asthma, Eczema, and Seasonal allergies.   Outpatient Encounter Medications as of 05/28/2020  Medication Sig  . penicillin v potassium (VEETID) 500 MG tablet Take 1 tablet (500 mg total) by mouth 3 (three) times daily for 10 days.   No facility-administered encounter medications on file as of 05/28/2020.     Review of Systems  Constitutional: Positive for appetite change and fatigue. Negative for chills and fever.  HENT: Positive for congestion, postnasal drip, sinus pressure, sinus pain and sore throat. Negative for ear pain.   Respiratory: Positive for cough (non productive). Negative for shortness of breath.   Cardiovascular: Negative for chest pain.  Musculoskeletal: Negative for myalgias.  Neurological: Negative for headaches.     Vitals BP 140/82   Pulse 86   Temp (!) 97.5 F (36.4 C)   Wt 240 lb (108.9 kg)   SpO2 97%   BMI 33.47 kg/m   Objective:   Physical Exam Vitals reviewed.  HENT:     Right Ear: Tympanic membrane normal.     Left Ear: Tympanic membrane normal.     Nose:     Right Turbinates: Swollen.     Left Turbinates: Swollen.     Right Sinus: No  maxillary sinus tenderness or frontal sinus tenderness.     Left Sinus: No maxillary sinus tenderness or frontal sinus tenderness.     Mouth/Throat:     Pharynx: Uvula midline. Posterior oropharyngeal erythema present. No oropharyngeal exudate.     Tonsils: 2+ on the right. 2+ on the left.  Cardiovascular:     Rate and Rhythm: Normal rate and regular rhythm.     Heart sounds: Normal heart sounds.  Pulmonary:     Effort: Pulmonary effort is normal.     Breath sounds: Normal breath sounds.  Skin:    General: Skin is warm and dry.  Neurological:     General: No focal deficit present.     Mental Status: He is alert.  Psychiatric:        Behavior: Behavior normal.     Results for orders placed or performed in visit on 05/28/20  POCT rapid strep A  Result Value Ref Range   Rapid Strep A Screen Positive (A) Negative     Assessment and Plan   1. Sore throat - POCT rapid strep A - Novel Coronavirus, NAA ([facility])  2. Streptococcal sore throat - penicillin v potassium (VEETID) 500 MG tablet; Take 1 tablet (500 mg total) by mouth 3 (three) times daily for 10 days.  Dispense: 30 tablet; Refill: 0   Blood pressure elevated in the  office today.  Information on DASH diet given, recommend follow-up in 1 month with PCP, Dr. Lilyan Punt for further evaluation of blood pressure once recovered from illness.   Rapid strep positive, will treat with antibiotics for 10 days.  Recommend supportive therapy, adequate hydration, warm salt water gargles for comfort.  Agrees with plan of care discussed today. Understands warning signs to seek further care: chest pain, shortness of breath, any significant change in health.  Understands to follow-up in 1 month for blood pressure check, with PCP, Dr. Lilyan Punt.  Will notify once results of COVID test become available   Novella Olive, NP 05/28/2020

## 2020-05-30 LAB — SARS-COV-2, NAA 2 DAY TAT

## 2020-05-30 LAB — NOVEL CORONAVIRUS, NAA: SARS-CoV-2, NAA: NOT DETECTED

## 2020-06-29 ENCOUNTER — Ambulatory Visit (INDEPENDENT_AMBULATORY_CARE_PROVIDER_SITE_OTHER): Payer: BC Managed Care – PPO | Admitting: Family Medicine

## 2020-06-29 ENCOUNTER — Other Ambulatory Visit: Payer: Self-pay

## 2020-06-29 VITALS — BP 116/68 | Temp 97.8°F | Wt 232.0 lb

## 2020-06-29 DIAGNOSIS — E669 Obesity, unspecified: Secondary | ICD-10-CM

## 2020-06-29 DIAGNOSIS — Z0130 Encounter for examination of blood pressure without abnormal findings: Secondary | ICD-10-CM | POA: Diagnosis not present

## 2020-06-29 NOTE — Progress Notes (Signed)
   Subjective:    Patient ID: Scott Lam, male    DOB: 1995-01-26, 25 y.o.   MRN: [id number]  HPI Pt here to have blood pressure rechecked. BP was elevated in May when pt saw Clydie Braun for strep throat. Pt states no issues with blood pressure.   Patient states he is trying to watch his diet he does try to plan on trying to lose some weight he is trying to stay physically active denies any headaches chest pain shortness of breath Review of Systems     Objective:   Physical Exam Lungs clear heart regular pulse normal BP good       Assessment & Plan:  Mild obesity dietary measures watch and portions staying active recommended  Blood pressure good control currently low-salt diet healthy choices regular physical activity

## 2020-06-29 NOTE — Patient Instructions (Signed)
High-Fiber Eating Plan Fiber, also called dietary fiber, is a type of carbohydrate. It is found foods such as fruits, vegetables, whole grains, and beans. A high-fiber diet can have many health benefits. Your health care provider may recommend a high-fiber diet to help: Prevent constipation. Fiber can make your bowel movements more regular. Lower your cholesterol. Relieve the following conditions: Inflammation of veins in the anus (hemorrhoids). Inflammation of specific areas of the digestive tract (uncomplicated diverticulosis). A problem of the large intestine, also called the colon, that sometimes causes pain and diarrhea (irritable bowel syndrome, or IBS). Prevent overeating as part of a weight-loss plan. Prevent heart disease, type 2 diabetes, and certain cancers. What are tips for following this plan? Reading food labels  Check the nutrition facts label on food products for the amount of dietary fiber. Choose foods that have 5 grams of fiber or more per serving. The goals for recommended daily fiber intake include: Men (age 68 or younger): 34-38 g. Men (over age 47): 28-34 g. Women (age 12 or younger): 25-28 g. Women (over age 81): 22-25 g. Your daily fiber goal is _____________ g. Shopping Choose whole fruits and vegetables instead of processed forms, such as apple juice or applesauce. Choose a wide variety of high-fiber foods such as avocados, lentils, oats, and kidney beans. Read the nutrition facts label of the foods you choose. Be aware of foods with added fiber. These foods often have high sugar and sodium amounts per serving. Cooking Use whole-grain flour for baking and cooking. Cook with brown rice instead of white rice. Meal planning Start the day with a breakfast that is high in fiber, such as a cereal that contains 5 g of fiber or more per serving. Eat breads and cereals that are made with whole-grain flour instead of refined flour or white flour. Eat brown rice, bulgur  wheat, or millet instead of white rice. Use beans in place of meat in soups, salads, and pasta dishes. Be sure that half of the grains you eat each day are whole grains. General information You can get the recommended daily intake of dietary fiber by: Eating a variety of fruits, vegetables, grains, nuts, and beans. Taking a fiber supplement if you are not able to take in enough fiber in your diet. It is better to get fiber through food than from a supplement. Gradually increase how much fiber you consume. If you increase your intake of dietary fiber too quickly, you may have bloating, cramping, or gas. Drink plenty of water to help you digest fiber. Choose high-fiber snacks, such as berries, raw vegetables, nuts, and popcorn. What foods should I eat? Fruits Berries. Pears. Apples. Oranges. Avocado. Prunes and raisins. Dried figs. Vegetables Sweet potatoes. Spinach. Kale. Artichokes. Cabbage. Broccoli. Cauliflower.Green peas. Carrots. Squash. Grains Whole-grain breads. Multigrain cereal. Oats and oatmeal. Brown rice. Barley.Bulgur wheat. Moonshine. Quinoa. Bran muffins. Popcorn. Rye wafer crackers. Meats and other proteins Navy beans, kidney beans, and pinto beans. Soybeans. Split peas. Lentils. Nutsand seeds. Dairy Fiber-fortified yogurt. Beverages Fiber-fortified soy milk. Fiber-fortified orange juice. Other foods Fiber bars. The items listed above may not be a complete list of recommended foods and beverages. Contact a dietitian for more information. What foods should I avoid? Fruits Fruit juice. Cooked, strained fruit. Vegetables Fried potatoes. Canned vegetables. Well-cooked vegetables. Grains White bread. Pasta made with refined flour. White rice. Meats and other proteins Fatty cuts of meat. Fried chicken or fried fish. Dairy Milk. Yogurt. Cream cheese. Sour cream. Fats and oils Butters. Beverages  Soft drinks. Other foods Cakes and pastries. The items listed above may not  be a complete list of foods and beverages to avoid. Talk with your dietitian about what choices are best for you. Summary Fiber is a type of carbohydrate. It is found in foods such as fruits, vegetables, whole grains, and beans. A high-fiber diet has many benefits. It can help to prevent constipation, lower blood cholesterol, aid weight loss, and reduce your risk of heart disease, diabetes, and certain cancers. Increase your intake of fiber gradually. Increasing fiber too quickly may cause cramping, bloating, and gas. Drink plenty of water while you increase the amount of fiber you consume. The best sources of fiber include whole fruits and vegetables, whole grains, nuts, seeds, and beans. This information is not intended to replace advice given to you by your health care provider. Make sure you discuss any questions you have with your healthcare provider. Document Revised: 05/09/2019 Document Reviewed: 05/09/2019 Elsevier Patient Education  2022 [employer]. Constipation, Adult Constipation is when a person has trouble pooping (having a bowel movement). When you have this condition, you may poop fewer than 3 times a week. Your poop (stool) may also be dry, hard, or bigger than normal. Follow these instructions at home: Eating and drinking  Eat foods that have a lot of fiber, such as: Fresh fruits and vegetables. Whole grains. Beans. Eat less of foods that are low in fiber and high in fat and sugar, such as: [place] fries. Hamburgers. Cookies. Candy. Soda. Drink enough fluid to keep your pee (urine) pale yellow.  General instructions Exercise regularly or as told by your doctor. Try to do 150 minutes of exercise each week. Go to the restroom when you feel like you need to poop. Do not hold it in. Take over-the-counter and prescription medicines only as told by your doctor. These include any fiber supplements. When you poop: Do deep breathing while relaxing your lower belly  (abdomen). Relax your pelvic floor. The pelvic floor is a group of muscles that support the rectum, bladder, and intestines (as well as the uterus in women). Watch your condition for any changes. Tell your doctor if you notice any. Keep all follow-up visits as told by your doctor. This is important. Contact a doctor if: You have pain that gets worse. You have a fever. You have not pooped for 4 days. You vomit. You are not hungry. You lose weight. You are bleeding from the opening of the butt (anus). You have thin, pencil-like poop. Get help right away if: You have a fever, and your symptoms suddenly get worse. You leak poop or have blood in your poop. Your belly feels hard or bigger than normal (bloated). You have very bad belly pain. You feel dizzy or you faint. Summary Constipation is when a person poops fewer than 3 times a week, has trouble pooping, or has poop that is dry, hard, or bigger than normal. Eat foods that have a lot of fiber. Drink enough fluid to keep your pee (urine) pale yellow. Take over-the-counter and prescription medicines only as told by your doctor. These include any fiber supplements. This information is not intended to replace advice given to you by your health care provider. Make sure you discuss any questions you have with your healthcare provider. Document Revised: 11/21/2018 Document Reviewed: 11/21/2018 Elsevier Patient Education  2022 [employer].

## 2020-07-22 IMAGING — DX DG WRIST COMPLETE 3+V*L*
4 series · 4 of 4 positions shown · non-contrast
Comparison: None.

CLINICAL DATA: Trauma 2 days ago.  Pain.

EXAM:
LEFT WRIST - COMPLETE 3+ VIEW

[wrist pa (1 of 2)]
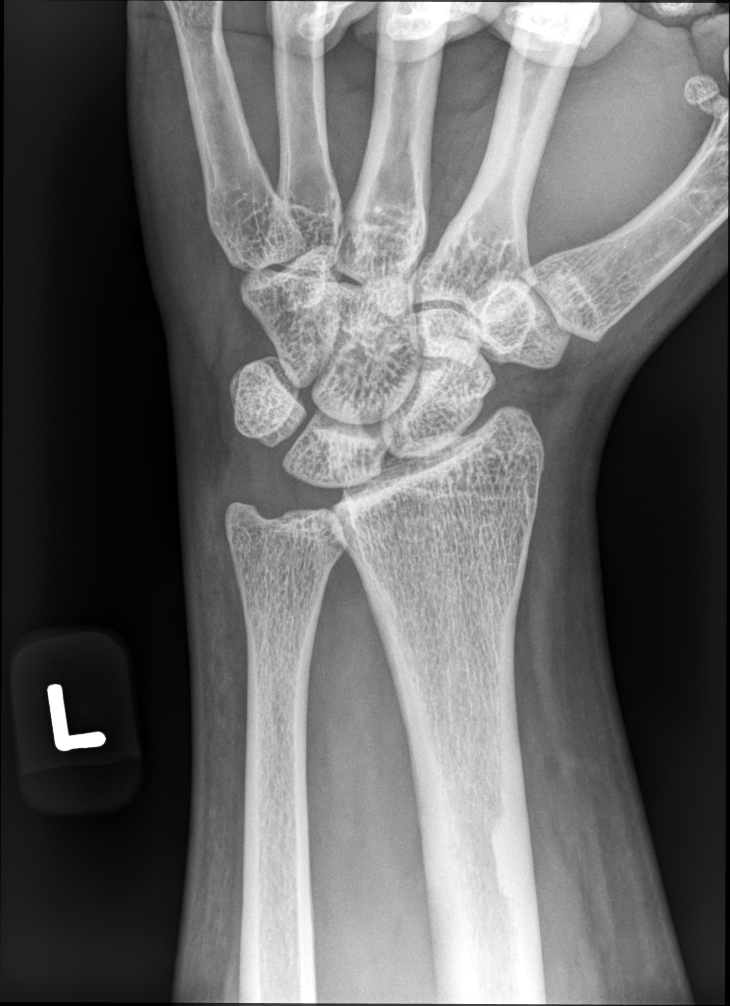

[wrist pa (2 of 2)]
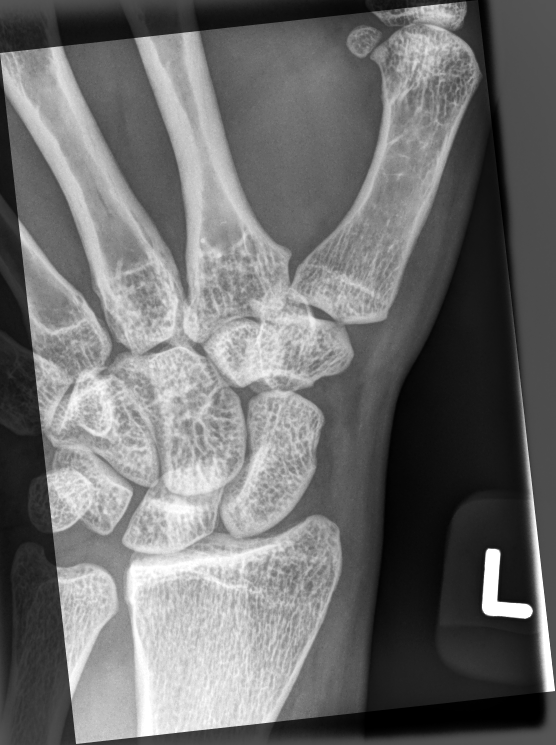

[wrist mlo]
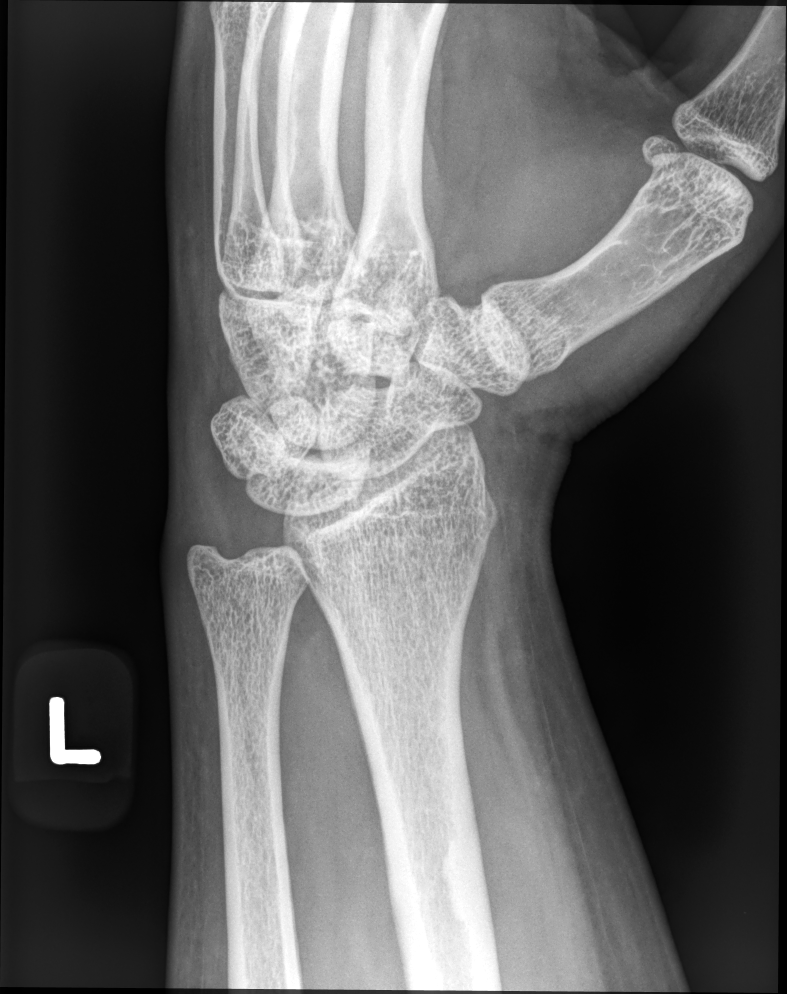

[wrist lat]
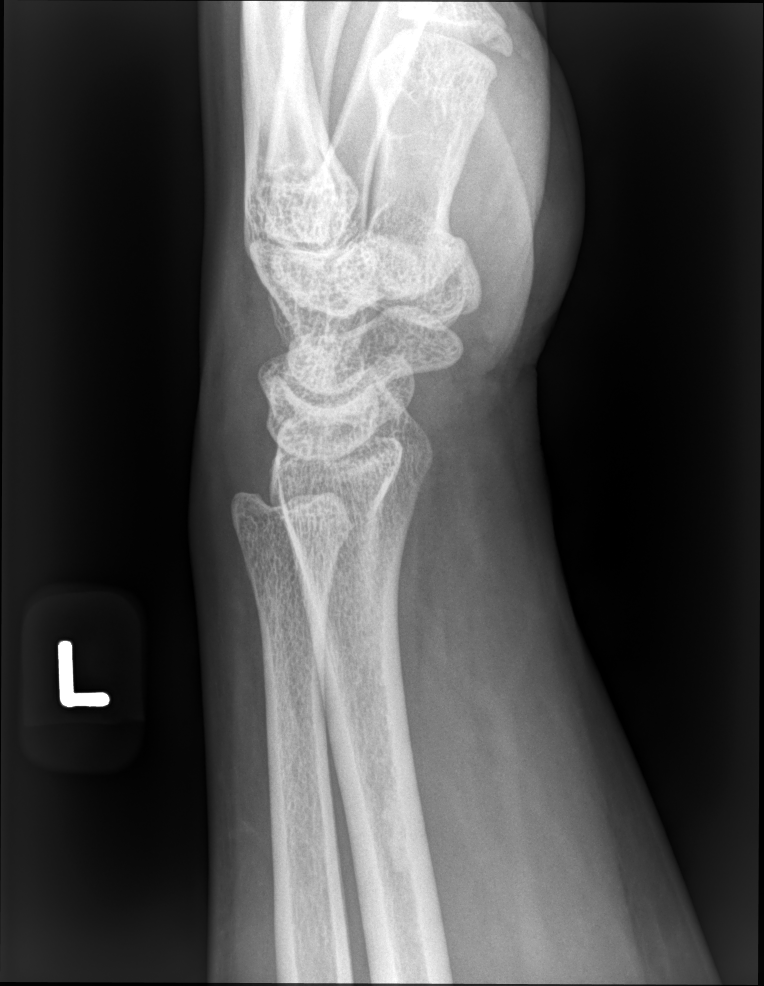

[4 of 4 positions shown; findings below may reference images not displayed]

FINDINGS: There is no evidence of fracture or dislocation. There is no
evidence of arthropathy or other focal bone abnormality. Soft
tissues are unremarkable.
IMPRESSION: Negative.

## 2020-09-28 ENCOUNTER — Other Ambulatory Visit: Payer: Self-pay

## 2020-09-28 ENCOUNTER — Encounter: Payer: Self-pay | Admitting: Emergency Medicine

## 2020-09-28 ENCOUNTER — Ambulatory Visit
Admission: EM | Admit: 2020-09-28 | Discharge: 2020-09-28 | Disposition: A | Discharge status: Home / Self Care | Payer: BC Managed Care – PPO | Attending: Family Medicine | Admitting: Family Medicine

## 2020-09-28 DIAGNOSIS — Z1152 Encounter for screening for COVID-19: Secondary | ICD-10-CM | POA: Diagnosis not present

## 2020-09-28 DIAGNOSIS — B349 Viral infection, unspecified: Secondary | ICD-10-CM | POA: Diagnosis not present

## 2020-09-28 DIAGNOSIS — R509 Fever, unspecified: Secondary | ICD-10-CM | POA: Insufficient documentation

## 2020-09-28 DIAGNOSIS — Z113 Encounter for screening for infections with a predominantly sexual mode of transmission: Secondary | ICD-10-CM | POA: Diagnosis not present

## 2020-09-28 LAB — POCT URINALYSIS DIP (MANUAL ENTRY)
Blood, UA: NEGATIVE
Glucose, UA: NEGATIVE mg/dL
Leukocytes, UA: NEGATIVE
Nitrite, UA: NEGATIVE
Protein Ur, POC: >=300 mg/dL — AB
Spec Grav, UA: >=1.03 — AB (ref 1.010–1.025)
Urobilinogen, UA: 0.2 E.U./dL
pH, UA: 5.5 (ref 5.0–8.0)

## 2020-09-28 LAB — POCT RAPID STREP A (OFFICE): Rapid Strep A Screen: NEGATIVE

## 2020-09-28 MED ORDER — LEVOCETIRIZINE DIHYDROCHLORIDE 5 MG PO TABS
5.0000 mg | ORAL_TABLET | Freq: Every evening | ORAL | 0 refills | Status: AC
Start: 2020-09-28 — End: ?

## 2020-09-28 MED ORDER — ACETAMINOPHEN 325 MG PO TABS
650.0000 mg | ORAL_TABLET | Freq: Once | ORAL | Status: AC
  Administered 2020-09-28: 650 mg via ORAL

## 2020-09-28 MED ORDER — PROMETHAZINE-DM 6.25-15 MG/5ML PO SYRP: 5.0000 mL | ORAL_SOLUTION | Freq: Three times a day (TID) | ORAL | 0 refills | Status: DC | PRN

## 2020-09-28 MED ORDER — IPRATROPIUM BROMIDE 0.03 % NA SOLN: 2.0000 | Freq: Three times a day (TID) | NASAL | 0 refills | Status: DC

## 2020-09-28 NOTE — Discharge Instructions (Signed)
STD testing results will be available within 2 to 3 days. Your COVID 19 results should result within 2-4 days. If any results are abnormal are staff will contact you by phone or to access your results more readily activate a MyChart. Positive results will receive a follow-up call from our clinic. If symptoms are present, I recommend home quarantine until results are known.  Alternate Tylenol and ibuprofen as needed for body aches and fever.  Symptom management per recommendations discussed today.  If any breathing difficulty or chest pain develops go immediately to the closest emergency department for evaluation.

## 2020-09-28 NOTE — ED Triage Notes (Addendum)
Sinus congestion, coughing up mucous, sore throat since Thursday.  Also states urine is dark for past 2 days and having some burning.  Pt request strep swab, states he has had strep several times in the past.

## 2020-09-28 NOTE — ED Provider Notes (Signed)
[facility]    CSN: [id number] Arrival date & time: 09/28/20  1319      History   Chief Complaint No chief complaint on file.   HPI CARLESTER KASPAREK Lam is a 25 y.o. male.   HPI Patient presents today with fever, cough and congestion present x5 days.  Co-worker recently sick. Reports a history of strep.  Would like strep testing.  He also complains of darkening urine along with dysuria over the last few days. Denies any new sexual partners or known exposure to STD. Drinking plenty of water  Past Medical History:  Diagnosis Date   Asthma    Eczema    Seasonal allergies     Patient Active Problem List   Diagnosis Date Noted   Sore throat 05/28/2020   Streptococcal sore throat 05/28/2020   Asthma 06/09/2014   Allergic rhinitis 10/02/2012    History reviewed. No pertinent surgical history.     Home Medications    Prior to Admission medications   Not on File    Family History Family History  Problem Relation Age of Onset   Healthy Mother    Healthy Father     Social History Social History   Tobacco Use   Smoking status: Never   Smokeless tobacco: Never  Vaping Use   Vaping Use: Never used  Substance Use Topics   Alcohol use: No   Drug use: No     Allergies   Patient has no known allergies.   Review of Systems Review of Systems Pertinent negatives listed in HPI  Physical Exam Triage Vital Signs ED Triage Vitals  Enc Vitals Group     BP 09/28/20 1632 115/73     Pulse Rate 09/28/20 1632 76     Resp 09/28/20 1632 18     Temp 09/28/20 1632 (!) 100.9 F (38.3 C)     Temp Source 09/28/20 1632 Oral     SpO2 09/28/20 1632 96 %     Weight --      Height --      Head Circumference --      Peak Flow --      Pain Score 09/28/20 1634 0     Pain Loc --      Pain Edu? --      Excl. in GC? --    No data found.  Updated Vital Signs BP 115/73 (BP Location: Right Arm)   Pulse 76   Temp (!) 100.9 F (38.3 C) (Oral)   Resp 18   SpO2  96%   Visual Acuity Right Eye Distance:   Left Eye Distance:   Bilateral Distance:    Right Eye Near:   Left Eye Near:    Bilateral Near:     Physical Exam  General Appearance:    Alert, cooperative, no distress  HENT:   Normocephalic, ears normal, nares mucosal edema with congestion, rhinorrhea, oropharynx    Eyes:    PERRL, conjunctiva/corneas clear, EOM's intact       Lungs:     Clear to auscultation bilaterally, respirations unlabored  Heart:    Regular rate and rhythm  Neurologic:   Awake, alert, oriented x 3. No apparent focal neurological           defect.    Self swab collected   UC Treatments / Results  Labs (all labs ordered are listed, but only abnormal results are displayed) Labs Reviewed  POCT URINALYSIS DIP (MANUAL ENTRY) - Abnormal; Notable for  the following components:      Result Value   Color, UA straw (*)    Bilirubin, UA small (*)    Ketones, POC UA trace (5) (*)    Spec Grav, UA >=1.030 (*)    Protein Ur, POC >=300 (*)    All other components within normal limits  COVID-19, FLU A+B NAA  POCT RAPID STREP A (OFFICE)  CYTOLOGY, (ORAL, ANAL, URETHRAL) ANCILLARY ONLY    EKG   Radiology No results found.  Procedures Procedures (including critical care time)  Medications Ordered in UC Medications  acetaminophen (TYLENOL) tablet 650 mg (650 mg Oral Given 09/28/20 1643)    Initial Impression / Assessment and Plan / UC Course  I have reviewed the triage vital signs and the nursing notes.  Pertinent labs & imaging results that were available during my care of the patient were reviewed by me and considered in my medical decision making (see chart for details).    COVID/Flu test pending. Symptom management warranted only.  Manage fever with Tylenol and ibuprofen.  Nasal symptoms with over-the-counter antihistamines recommended.  Treatment per discharge medications/discharge instructions.  Red flags/ER precautions given. The most current CDC  isolation/quarantine recommendation advised.   STI cytology pending. No treatment given here in clinic. RTC PRN Final Clinical Impressions(s) / UC Diagnoses   Final diagnoses:  Fever, unspecified  Viral illness  Encounter for screening for COVID-19  Screen for STD (sexually transmitted disease)   Discharge Instructions   None    ED Prescriptions   None    PDMP not reviewed this encounter.   Bing Neighbors, FNP 09/28/20 1736

## 2020-09-29 ENCOUNTER — Encounter: Payer: BC Managed Care – PPO | Admitting: Family Medicine

## 2020-09-29 LAB — COVID-19, FLU A+B NAA
Influenza A, NAA: NOT DETECTED
Influenza B, NAA: NOT DETECTED
SARS-CoV-2, NAA: DETECTED — AB

## 2020-09-29 LAB — CYTOLOGY, (ORAL, ANAL, URETHRAL) ANCILLARY ONLY
Chlamydia: NEGATIVE
Comment: NEGATIVE
Comment: NEGATIVE
Comment: NORMAL
Neisseria Gonorrhea: NEGATIVE
Trichomonas: NEGATIVE

## 2020-12-28 DIAGNOSIS — M2141 Flat foot [pes planus] (acquired), right foot: Secondary | ICD-10-CM | POA: Diagnosis not present

## 2020-12-28 DIAGNOSIS — M79671 Pain in right foot: Secondary | ICD-10-CM | POA: Diagnosis not present

## 2021-03-11 ENCOUNTER — Encounter: Payer: Self-pay | Admitting: Nurse Practitioner

## 2021-03-11 ENCOUNTER — Ambulatory Visit (INDEPENDENT_AMBULATORY_CARE_PROVIDER_SITE_OTHER): Payer: BC Managed Care – PPO | Admitting: Nurse Practitioner

## 2021-03-11 ENCOUNTER — Other Ambulatory Visit: Payer: Self-pay

## 2021-03-11 VITALS — BP 139/81 | HR 67 | Temp 98.7°F | Ht 71.0 in | Wt 227.4 lb

## 2021-03-11 DIAGNOSIS — H6123 Impacted cerumen, bilateral: Secondary | ICD-10-CM | POA: Insufficient documentation

## 2021-03-11 DIAGNOSIS — J029 Acute pharyngitis, unspecified: Secondary | ICD-10-CM | POA: Diagnosis not present

## 2021-03-11 LAB — POCT RAPID STREP A (OFFICE): Rapid Strep A Screen: NEGATIVE

## 2021-03-11 NOTE — Progress Notes (Signed)
° °  Subjective:    Patient ID: Scott Lam, male    DOB: 07-24-1995, 26 y.o.   MRN: [id number]  HPI  Patient reports to clinic for is c/o sore, left ear pain, congestion, nonproductive cough, runny nose x2 days days.  Patient denies shortness of breath, wheezing, fever, sinus pressure.  However patient thinks that he may have had body aches and chills.  Patient has history of cerumen impaction and states that he thinks he hit may have earwax in his left ear which is causing some.  Patient has been using Debrox at home but still believes that he has earwax buildup.  Review of Systems  Constitutional:  Positive for chills.  HENT:  Positive for congestion, ear pain, rhinorrhea and sore throat.   Respiratory:  Positive for cough.       Objective:   Physical Exam Constitutional:      General: He is not in acute distress.    Appearance: He is well-developed. He is not ill-appearing or toxic-appearing.  HENT:     Head: Normocephalic.     Right Ear: Hearing, ear canal and external ear normal. There is impacted cerumen.     Left Ear: Hearing, ear canal and external ear normal. There is impacted cerumen.     Ears:     Comments: Bilateral cerumen impaction which obscured view of bilateral tympanic membranes    Nose: No congestion or rhinorrhea.     Mouth/Throat:     Mouth: Mucous membranes are moist. No oral lesions.     Pharynx: Posterior oropharyngeal erythema present. No pharyngeal swelling, oropharyngeal exudate or uvula swelling.     Tonsils: No tonsillar exudate or tonsillar abscesses. 1+ on the right. 1+ on the left.  Eyes:     Conjunctiva/sclera: Conjunctivae normal.     Pupils: Pupils are equal, round, and reactive to light.  Neck:     Thyroid: No thyromegaly.  Cardiovascular:     Rate and Rhythm: Normal rate and regular rhythm.     Heart sounds: Normal heart sounds. No murmur heard. Pulmonary:     Effort: Pulmonary effort is normal. No respiratory distress.     Breath  sounds: Normal breath sounds. No wheezing.  Musculoskeletal:     Cervical back: Normal range of motion and neck supple.  Lymphadenopathy:     Cervical: Cervical adenopathy present.  Skin:    General: Skin is warm.     Capillary Refill: Capillary refill takes less than 2 seconds.  Neurological:     General: No focal deficit present.     Mental Status: He is alert and oriented to person, place, and time.  Psychiatric:        Mood and Affect: Mood normal.        Behavior: Behavior normal.          Assessment & Plan:   1. Sore throat -Likely viral in etiology -However strep throat considered - POCT rapid strep A = NEGATIVE - Culture, Group A Strep pending - COVID-19, Flu A+B and RSV pending -Viral illnesses typically last 5 to 7 days.  Patient instructed to return to clinic on Monday (day 6) if not feeling better.  2. Bilateral impacted cerumen -Ear irrigation completed to bilateral ears. -Cerumen not completely cleared. -Patient instructed to continue to use Debrox at home and he can return to clinic on Monday to have ears irrigated again. -If ear irrigation not successful at that time will consider ENT referral.

## 2021-03-12 LAB — COVID-19, FLU A+B AND RSV
Influenza A, NAA: NOT DETECTED
Influenza B, NAA: NOT DETECTED
RSV, NAA: NOT DETECTED
SARS-CoV-2, NAA: NOT DETECTED

## 2021-03-14 LAB — CULTURE, GROUP A STREP: Strep A Culture: NEGATIVE

## 2021-08-02 ENCOUNTER — Ambulatory Visit
Admission: EM | Admit: 2021-08-02 | Discharge: 2021-08-02 | Disposition: A | Discharge status: Home / Self Care | Payer: BC Managed Care – PPO | Attending: Nurse Practitioner | Admitting: Nurse Practitioner

## 2021-08-02 DIAGNOSIS — T23029A Burn of unspecified degree of unspecified single finger (nail) except thumb, initial encounter: Secondary | ICD-10-CM | POA: Diagnosis not present

## 2021-08-02 MED ORDER — SILVER SULFADIAZINE 1 % EX CREA: 1.0000 | TOPICAL_CREAM | Freq: Every day | CUTANEOUS | 0 refills | Status: DC

## 2021-08-02 NOTE — Discharge Instructions (Signed)
Keep the areas clean and dry. Do not pick or disrupt the blisters that have formed. May apply ice compresses to help with pain or swelling. Continue to monitor for signs of infection.  If you develop fever, chills, there is increased swelling, redness, or foul-smelling drainage from the burn sites, please follow-up in this clinic or in the emergency department immediately. May take over-the-counter ibuprofen or Tylenol as needed for pain, fever, or general discomfort. Follow-up as needed.

## 2021-08-02 NOTE — ED Triage Notes (Signed)
Pt presents with burn on left and right index finger yesterday while cooking, blister noted

## 2021-08-02 NOTE — ED Provider Notes (Signed)
[facility]    CSN: [id number] Arrival date & time: 08/02/21  5956      History   Chief Complaint Chief Complaint  Patient presents with   Hand Burn    HPI Scott Lam is a 26 y.o. male.   HPI  Patient presents for Lackie to his bilateral index fingers that occurred 1 day ago while he was cooking.  Patient states that he was using a skillet when the skillet became too hot and grease got on his fingers.  Today he presents with blistering to his bilateral index fingers.  He denies fever, chills, fatigue, numbness, tingling, or foul-smelling drainage.  Patient states he has been using Neosporin for his symptoms.  Past Medical History:  Diagnosis Date   Asthma    Eczema    Seasonal allergies     Patient Active Problem List   Diagnosis Date Noted   Bilateral impacted cerumen 03/11/2021   Sore throat 05/28/2020   Streptococcal sore throat 05/28/2020   Asthma 06/09/2014   Allergic rhinitis 10/02/2012    History reviewed. No pertinent surgical history.     Home Medications    Prior to Admission medications   Medication Sig Start Date End Date Taking? Authorizing Provider  silver sulfADIAZINE (SILVADENE) 1 % cream Apply 1 Application topically daily. 08/02/21  Yes Georgi Navarrete-Warren, Sadie Haber, NP  ipratropium (ATROVENT) 0.03 % nasal spray Place 2 sprays into both nostrils 3 (three) times daily. Patient not taking: Reported on 03/11/2021 09/28/20   Bing Neighbors, FNP  levocetirizine (XYZAL) 5 MG tablet Take 1 tablet (5 mg total) by mouth every evening. Patient not taking: Reported on 03/11/2021 09/28/20   Bing Neighbors, FNP  promethazine-dextromethorphan (PROMETHAZINE-DM) 6.25-15 MG/5ML syrup Take 5 mLs by mouth 3 (three) times daily as needed for cough. 09/28/20   Bing Neighbors, FNP    Family History Family History  Problem Relation Age of Onset   Healthy Mother    Healthy Father     Social History Social History   Tobacco Use   Smoking  status: Never   Smokeless tobacco: Never  Vaping Use   Vaping Use: Never used  Substance Use Topics   Alcohol use: No   Drug use: No     Allergies   Patient has no known allergies.   Review of Systems Review of Systems Per HPI  Physical Exam Triage Vital Signs ED Triage Vitals  Enc Vitals Group     BP 08/02/21 0825 140/83     Pulse Rate 08/02/21 0825 66     Resp 08/02/21 0825 20     Temp 08/02/21 0825 99.8 F (37.7 C)     Temp src --      SpO2 08/02/21 0825 97 %     Weight --      Height --      Head Circumference --      Peak Flow --      Pain Score 08/02/21 0823 3     Pain Loc --      Pain Edu? --      Excl. in GC? --    No data found.  Updated Vital Signs BP 140/83   Pulse 66   Temp 99.8 F (37.7 C)   Resp 20   SpO2 97%   Visual Acuity Right Eye Distance:   Left Eye Distance:   Bilateral Distance:    Right Eye Near:   Left Eye Near:    Bilateral  Near:     Physical Exam Vitals and nursing note reviewed.  Constitutional:      General: He is not in acute distress.    Appearance: Normal appearance.  HENT:     Head: Normocephalic.  Eyes:     Extraocular Movements: Extraocular movements intact.     Conjunctiva/sclera: Conjunctivae normal.     Pupils: Pupils are equal, round, and reactive to light.  Pulmonary:     Effort: Pulmonary effort is normal.  Skin:    General: Skin is warm and dry.     Findings: Burn present.     Comments: Blistering noted to the middle and proximal joint of the left index finger.  Blistering is also noted to the proximal joint of the right index finger.  There is no oozing, fluctuance, or drainage present.  Blisters are surrounded by erythematous base.  No obvious signs of infection are noted.  Neurological:     General: No focal deficit present.     Mental Status: He is alert and oriented to person, place, and time.  Psychiatric:        Mood and Affect: Mood normal.        Behavior: Behavior normal.      UC  Treatments / Results  Labs (all labs ordered are listed, but only abnormal results are displayed) Labs Reviewed - No data to display  EKG   Radiology No results found.  Procedures Procedures (including critical care time)  Medications Ordered in UC Medications - No data to display  Initial Impression / Assessment and Plan / UC Course  I have reviewed the triage vital signs and the nursing notes.  Pertinent labs & imaging results that were available during my care of the patient were reviewed by me and considered in my medical decision making (see chart for details).  Patient presents for complaints of burn to his bilateral index fingers while cooking.  On exam, patient has no obvious signs of infection present.  Symptoms are consistent with a burn injury.  Patient's vital signs are stable, he is in no acute distress.  We will start patient with Silvadene cream to apply to the affected areas.  Discussed indications of when to follow-up with the clinic, specifically for signs of infection.  Patient was advised to follow-up as needed. Final Clinical Impressions(s) / UC Diagnoses   Final diagnoses:  Burn injury of skin of finger     Discharge Instructions      Keep the areas clean and dry. Do not pick or disrupt the blisters that have formed. May apply ice compresses to help with pain or swelling. Continue to monitor for signs of infection.  If you develop fever, chills, there is increased swelling, redness, or foul-smelling drainage from the burn sites, please follow-up in this clinic or in the emergency department immediately. May take over-the-counter ibuprofen or Tylenol as needed for pain, fever, or general discomfort. Follow-up as needed.      ED Prescriptions     Medication Sig Dispense Auth. Provider   silver sulfADIAZINE (SILVADENE) 1 % cream Apply 1 Application topically daily. 50 g Tika Hannis-Warren, Sadie Haber, NP      PDMP not reviewed this encounter.    Abran Cantor, NP 08/02/21 [phone]

## 2021-08-17 ENCOUNTER — Encounter: Payer: Self-pay | Admitting: Nurse Practitioner

## 2021-08-17 ENCOUNTER — Ambulatory Visit: Payer: BC Managed Care – PPO | Admitting: Nurse Practitioner

## 2021-08-17 VITALS — BP 142/72 | HR 74 | Temp 98.4°F | Ht 71.0 in | Wt 223.6 lb

## 2021-08-17 DIAGNOSIS — K148 Other diseases of tongue: Secondary | ICD-10-CM

## 2021-08-17 MED ORDER — LIDOCAINE VISCOUS HCL 2 % MT SOLN: 15.0000 mL | Freq: Four times a day (QID) | OROMUCOSAL | 0 refills | Status: DC | PRN

## 2021-08-17 NOTE — Progress Notes (Signed)
Patient reports rash/bumps on tongue x 3 months went to dentist and tried home remedies with no relief

## 2021-08-17 NOTE — Progress Notes (Signed)
   Subjective:    Patient ID: Scott Lam, male    DOB: 03/01/1995, 26 y.o.   MRN: [id number]  HPI  Patient reports rash/bumps on tongue x 3 months that comes and goes.  Went to dentist and tried home remedies with no relief.  Patient states that he was initially diagnosed with gastric reflux and was told to try an over-the-counter acid reducer, swish with salt water, use Listerine, and start eating before bed.  Patient states that none of those have helped.  Patient states that the rash comes and goes.  Patient states that sometimes the rash will come as white lines that can be circular in nature and moves around.  Patient states that the rash was more prevalent this morning but has since gone away.  Patient has a photo of what the area did look like to provide the provider.  Patient denies any white coating, swelling, difficulty swallowing, difficulty breathing, ulcerations, growths.  Review of Systems  HENT:         Tongue lesion       Objective:   Physical Exam Constitutional:      General: He is not in acute distress.    Appearance: Normal appearance. He is normal weight. He is not ill-appearing or toxic-appearing.  HENT:     Head: Normocephalic and atraumatic.     Mouth/Throat:     Mouth: Mucous membranes are moist.     Pharynx: Oropharynx is clear. No oropharyngeal exudate or posterior oropharyngeal erythema.     Comments: Normal-appearing tongue.  No lesions noted during exam no swelling no erythema. Neurological:     Mental Status: He is alert.  Psychiatric:        Mood and Affect: Mood normal.        Behavior: Behavior normal.           Assessment & Plan:   1. Tongue lesion -Suspect geographical tongue -However will rule out vitamin deficiencies - B12 and Folate Panel - CBC with Differential - CMP14+EGFR -We will order lidocaine solution for symptom relief - lidocaine (XYLOCAINE) 2 % solution; Use as directed 15 mLs in the mouth or throat every 6 (six)  hours as needed for mouth pain.  Dispense: 150 mL; Refill: 0 -Return to clinic within 2 to 4 weeks for routine follow-up    Note:  This document was prepared using Dragon voice recognition software and may include unintentional dictation errors. Note - This record has been created using [employer].  Chart creation errors have been sought, but may not always  have been located. Such creation errors do not reflect on  the standard of medical care.

## 2021-08-18 LAB — CBC WITH DIFFERENTIAL/PLATELET
Basophils Absolute: 0 10*3/uL (ref 0.0–0.2)
Basos: 0 %
EOS (ABSOLUTE): 0.1 10*3/uL (ref 0.0–0.4)
Eos: 2 %
Hematocrit: 47.9 % (ref 37.5–51.0)
Hemoglobin: 15.8 g/dL (ref 13.0–17.7)
Immature Grans (Abs): 0 10*3/uL (ref 0.0–0.1)
Immature Granulocytes: 0 %
Lymphocytes Absolute: 1.4 10*3/uL (ref 0.7–3.1)
Lymphs: 28 %
MCH: 29.9 pg (ref 26.6–33.0)
MCHC: 33 g/dL (ref 31.5–35.7)
MCV: 91 fL (ref 79–97)
Monocytes Absolute: 0.6 10*3/uL (ref 0.1–0.9)
Monocytes: 11 %
Neutrophils Absolute: 2.9 10*3/uL (ref 1.4–7.0)
Neutrophils: 59 %
Platelets: 251 10*3/uL (ref 150–450)
RBC: 5.28 x10E6/uL (ref 4.14–5.80)
RDW: 12.6 % (ref 11.6–15.4)
WBC: 5 10*3/uL (ref 3.4–10.8)

## 2021-08-18 LAB — B12 AND FOLATE PANEL
Folate: 6.7 ng/mL (ref >3.0)
Vitamin B-12: 740 pg/mL (ref 232–1245)

## 2021-08-18 LAB — CMP14+EGFR
ALT: 18 IU/L (ref 0–44)
AST: 15 IU/L (ref 0–40)
Albumin/Globulin Ratio: 1.5 (ref 1.2–2.2)
Albumin: 4.6 g/dL (ref 4.3–5.2)
Alkaline Phosphatase: 52 IU/L (ref 44–121)
BUN/Creatinine Ratio: 16 (ref 9–20)
BUN: 16 mg/dL (ref 6–20)
Bilirubin Total: 0.5 mg/dL (ref 0.0–1.2)
CO2: 23 mmol/L (ref 20–29)
Calcium: 9.8 mg/dL (ref 8.7–10.2)
Chloride: 102 mmol/L (ref 96–106)
Creatinine, Ser: 1.02 mg/dL (ref 0.76–1.27)
Globulin, Total: 3 g/dL (ref 1.5–4.5)
Glucose: 89 mg/dL (ref 70–99)
Potassium: 5.5 mmol/L — ABNORMAL HIGH (ref 3.5–5.2)
Sodium: 141 mmol/L (ref 134–144)
Total Protein: 7.6 g/dL (ref 6.0–8.5)
eGFR: 105 mL/min/{1.73_m2} (ref >59)

## 2021-08-26 ENCOUNTER — Encounter: Payer: Self-pay | Admitting: Nurse Practitioner

## 2021-08-26 ENCOUNTER — Ambulatory Visit: Payer: BC Managed Care – PPO | Admitting: Nurse Practitioner

## 2021-08-26 VITALS — BP 130/82 | HR 68 | Ht 70.0 in | Wt 222.4 lb

## 2021-08-26 DIAGNOSIS — E875 Hyperkalemia: Secondary | ICD-10-CM

## 2021-08-26 DIAGNOSIS — R1030 Lower abdominal pain, unspecified: Secondary | ICD-10-CM | POA: Diagnosis not present

## 2021-08-26 DIAGNOSIS — R3989 Other symptoms and signs involving the genitourinary system: Secondary | ICD-10-CM | POA: Diagnosis not present

## 2021-08-26 NOTE — Progress Notes (Signed)
   Subjective:    Patient ID: Scott Lam, male    DOB: [birth year], 26 y.o.   MRN: [id number]  HPI  For about 2-2.5 years bladder seems to be more active than it should be mainly when laying down.  Patient states that he has to get up every 2-4 hours to use the restroom which interrupts his sleep.  Patient has started to cut fluid intake of 3 to 4 hours before he goes to sleep.  Patient states that he has lower abdominal discomfort that radiates to his private area when laying down.  Patient admits to having normal stools at least once a day.  Patient denies any changes to the flow of his urine.  Patient denies any burning or blood in his urine.  Patient concerned that due to history of holding his urine perhaps he is experiencing symptoms because of that.  Review of Systems  Gastrointestinal:        Lower abdominal pressure  All other systems reviewed and are negative.      Objective:   Physical Exam Vitals reviewed.  Constitutional:      General: He is not in acute distress.    Appearance: Normal appearance. He is normal weight. He is not ill-appearing, toxic-appearing or diaphoretic.  HENT:     Head: Normocephalic and atraumatic.  Cardiovascular:     Rate and Rhythm: Normal rate and regular rhythm.     Pulses: Normal pulses.     Heart sounds: Normal heart sounds. No murmur heard. Pulmonary:     Effort: Pulmonary effort is normal. No respiratory distress.     Breath sounds: Normal breath sounds. No wheezing.  Abdominal:     Hernia: There is no hernia in the left inguinal area or right inguinal area.  Genitourinary:    Penis: Normal.      Testes: Normal.     Comments: GU exam completed by patient's PCP, Dr. Sallee Lange per patient's request Musculoskeletal:     Cervical back: Normal range of motion and neck supple. No rigidity or tenderness.     Comments: Grossly intact  Lymphadenopathy:     Cervical: No cervical adenopathy.     Lower Body: No right inguinal  adenopathy. No left inguinal adenopathy.  Skin:    General: Skin is warm.     Capillary Refill: Capillary refill takes less than 2 seconds.  Neurological:     Mental Status: He is alert.     Comments: Grossly intact  Psychiatric:        Mood and Affect: Mood normal.        Behavior: Behavior normal.           Assessment & Plan:  1. Sensation of pressure in bladder area -Consider inguinal hernia however no abnormality seen with GU exam. -Will refer to urology for further examination - CMP14+EGFR - PSA - Ambulatory referral to Urology -Return to clinic if symptoms persist or worsen  2.  Hyperkalemia -We will recheck potassium level due to potassium level of 5.52 weeks ago   Note:  This document was prepared using Dragon voice recognition software and may include unintentional dictation errors. Note - This record has been created using [employer].  Chart creation errors have been sought, but may not always  have been located. Such creation errors do not reflect on  the standard of medical care.

## 2021-08-27 ENCOUNTER — Encounter: Payer: Self-pay | Admitting: Nurse Practitioner

## 2021-08-27 LAB — CMP14+EGFR
ALT: 15 IU/L (ref 0–44)
AST: 18 IU/L (ref 0–40)
Albumin/Globulin Ratio: 1.5 (ref 1.2–2.2)
Albumin: 4.6 g/dL (ref 4.3–5.2)
Alkaline Phosphatase: 53 IU/L (ref 44–121)
BUN/Creatinine Ratio: 11 (ref 9–20)
BUN: 12 mg/dL (ref 6–20)
Bilirubin Total: 0.7 mg/dL (ref 0.0–1.2)
CO2: 25 mmol/L (ref 20–29)
Calcium: 10.1 mg/dL (ref 8.7–10.2)
Chloride: 99 mmol/L (ref 96–106)
Creatinine, Ser: 1.13 mg/dL (ref 0.76–1.27)
Globulin, Total: 3 g/dL (ref 1.5–4.5)
Glucose: 88 mg/dL (ref 70–99)
Potassium: 4.7 mmol/L (ref 3.5–5.2)
Sodium: 137 mmol/L (ref 134–144)
Total Protein: 7.6 g/dL (ref 6.0–8.5)
eGFR: 93 mL/min/{1.73_m2} (ref >59)

## 2021-08-27 LAB — PSA: Prostate Specific Ag, Serum: 0.7 ng/mL (ref 0.0–4.0)

## 2021-09-21 ENCOUNTER — Ambulatory Visit: Payer: BC Managed Care – PPO | Admitting: Urology

## 2021-09-21 ENCOUNTER — Encounter: Payer: Self-pay | Admitting: Urology

## 2021-09-21 VITALS — BP 151/84 | HR 63

## 2021-09-21 DIAGNOSIS — R102 Pelvic and perineal pain: Secondary | ICD-10-CM

## 2021-09-21 DIAGNOSIS — R350 Frequency of micturition: Secondary | ICD-10-CM | POA: Diagnosis not present

## 2021-09-21 DIAGNOSIS — R351 Nocturia: Secondary | ICD-10-CM

## 2021-09-21 LAB — URINALYSIS, ROUTINE W REFLEX MICROSCOPIC
Bilirubin, UA: NEGATIVE
Glucose, UA: NEGATIVE
Ketones, UA: NEGATIVE
Leukocytes,UA: NEGATIVE
Nitrite, UA: NEGATIVE
Protein,UA: NEGATIVE
RBC, UA: NEGATIVE
Specific Gravity, UA: <1.005 — ABNORMAL LOW (ref 1.005–1.030)
Urobilinogen, Ur: 0.2 mg/dL (ref 0.2–1.0)
pH, UA: 5.5 (ref 5.0–7.5)

## 2021-09-21 LAB — BLADDER SCAN AMB NON-IMAGING: Scan Result: 0

## 2021-09-21 MED ORDER — OXYBUTYNIN CHLORIDE 5 MG PO TABS: ORAL_TABLET | ORAL | 11 refills | Status: DC

## 2021-09-21 NOTE — Progress Notes (Signed)
H&P  Chief Complaint: Pelvic discomfort, nocturia  History of Present Illness: 26 year old healthy male comes in today for 18 months of lower abdominal/pelvic symptoms.  Never had any urologic issues before 18 months ago.  At that time was having sex that was interrupted.  He had some discomfort from that, and promptly masturbated to relieve himself.  Since then, he has had interesting symptoms.  Almost on a nightly basis, he has nocturia every 2-3 hours.  Discomfort in his lower abdomen wakes him up.  He never has a poor stream, feels like he empties well.  No dysuria, gross hematuria.  Never before had urinary tract infection or STDs.  He has been checked for both.  He denies any improvement of his symptoms with climax.  He denies any discomfort with climax.  He did have constipation on a regular basis until about 6 months ago.  At that time he was having bowel movements every 3 to 4 days.  Now, with increasing fluids and fiber supplements, he has bowel movements every day or so.  Discomfort/symptoms do not get better with defecation.  He has limited his evening fluids as well as caffeine with no improvement in symptoms.  He is not on decongestants on a regular basis.  Past Medical History:  Diagnosis Date   Asthma    Eczema    Seasonal allergies     No past surgical history on file.  Home Medications:  Allergies as of 09/21/2021   No Known Allergies      Medication List        Accurate as of September 21, 2021 11:45 AM. If you have any questions, ask your nurse or doctor.          ipratropium 0.03 % nasal spray Commonly known as: ATROVENT Place 2 sprays into both nostrils 3 (three) times daily.   levocetirizine 5 MG tablet Commonly known as: XYZAL Take 1 tablet (5 mg total) by mouth every evening.   lidocaine 2 % solution Commonly known as: XYLOCAINE Use as directed 15 mLs in the mouth or throat every 6 (six) hours as needed for mouth pain.   promethazine-dextromethorphan  6.25-15 MG/5ML syrup Commonly known as: PROMETHAZINE-DM Take 5 mLs by mouth 3 (three) times daily as needed for cough.   silver sulfADIAZINE 1 % cream Commonly known as: SILVADENE Apply 1 Application topically daily.        Allergies: No Known Allergies  Family History  Problem Relation Age of Onset   Healthy Mother    Healthy Father     Social History:  reports that he has never smoked. He has never used smokeless tobacco. He reports that he does not drink alcohol and does not use drugs.  ROS: A complete review of systems was performed.  All systems are negative except for pertinent findings as noted.  Physical Exam:  Vital signs in last 24 hours: There were no vitals taken for this visit. Constitutional:  Alert and oriented, No acute distress Cardiovascular: Regular rate  Respiratory: Normal respiratory effort GI: Abdomen is soft, nontender, nondistended, no abdominal masses. No CVAT.  No inguinal hernias. Genitourinary: Normal male phallus, testes are descended bilaterally and non-tender and without masses, scrotum is normal in appearance without lesions or masses, perineum is normal on inspection.  Normal anal sphincter tone.  Perhaps some increased pelvic floor muscular tone.  No prostatic abnormalities.  Prostate small. Lymphatic: No lymphadenopathy Neurologic: Grossly intact, no focal deficits Psychiatric: Normal mood and affect  I have  reviewed prior pt notes  I have reviewed notes from referring/previous physicians  I have reviewed urinalysis results  I have independently reviewed prior imaging-bladder scan residual volume 0    Impression/Assessment:  Nocturia.  Somewhat bothersome in this healthy 26 year old  Pelvic floor discomfort  Plan:  I reassured the patient that I do not think he has any significant pathologic issue  I did discuss the fact that he may have pelvic floor syndrome.  I wrote this down for him so he can look it up and perhaps begin  self-help techniques  I also gave him oxybutynin 5 mg to take on an as-needed basis prior to bedtime  I will see back in about 2 months to recheck

## 2021-09-21 NOTE — Progress Notes (Signed)
post void residual=0 ?

## 2021-11-22 NOTE — Progress Notes (Signed)
  History of Present Illness: Here 2 months after his initial visit for pelvic discomfort, nocturnal frequency.  This was present for 2 years prior to his initial visit.  I gave him as needed oxybutynin as well as instructions on doing some research on pelvic floor syndrome.  Oxybutynin really did not help his nighttime frequency.  He did not read about pelvic floor syndrome.  Still with nocturia every 2-3 hours.  Discomfort comes with his bladder feeling.  He has had difficulty sleeping for quite some time, even before onset of his current symptoms.  He has had no gross hematuria or dysuria.  Past Medical History:  Diagnosis Date   Asthma    Eczema    Seasonal allergies     No past surgical history on file.  Home Medications:  Allergies as of 11/23/2021   No Known Allergies      Medication List        Accurate as of November 22, 2021  7:29 PM. If you have any questions, ask your nurse or doctor.          ipratropium 0.03 % nasal spray Commonly known as: ATROVENT Place 2 sprays into both nostrils 3 (three) times daily.   levocetirizine 5 MG tablet Commonly known as: XYZAL Take 1 tablet (5 mg total) by mouth every evening.   lidocaine 2 % solution Commonly known as: XYLOCAINE Use as directed 15 mLs in the mouth or throat every 6 (six) hours as needed for mouth pain.   oxybutynin 5 MG tablet Commonly known as: DITROPAN 1 tab po qhs   promethazine-dextromethorphan 6.25-15 MG/5ML syrup Commonly known as: PROMETHAZINE-DM Take 5 mLs by mouth 3 (three) times daily as needed for cough.   silver sulfADIAZINE 1 % cream Commonly known as: SILVADENE Apply 1 Application topically daily.        Allergies: No Known Allergies  Family History  Problem Relation Age of Onset   Healthy Mother    Healthy Father     Social History:  reports that he has never smoked. He has never used smokeless tobacco. He reports that he does not drink alcohol and does not use  drugs.  ROS: A complete review of systems was performed.  All systems are negative except for pertinent findings as noted.  Physical Exam:  Vital signs in last 24 hours: There were no vitals taken for this visit. Constitutional:  Alert and oriented, No acute distress Cardiovascular: Regular rate  Respiratory: Normal respiratory effort Psychiatric: Normal mood and affect  I have reviewed prior pt notes  I have reviewed urinalysis results-clear     Impression/Assessment:  1.  Lower abdominal discomfort-perhaps not of GU origin  2.  Nocturnal frequency, frequency not present during the day  3.  Perhaps pelvic floor syndrome  Plan:  1.  I will give him a month trial of alfuzosin as well as 10 days of Augmentin  2.  I will see back in about a month to recheck

## 2021-11-23 ENCOUNTER — Ambulatory Visit: Payer: BC Managed Care – PPO | Admitting: Urology

## 2021-11-23 ENCOUNTER — Encounter: Payer: Self-pay | Admitting: Urology

## 2021-11-23 VITALS — BP 127/79 | HR 67 | Ht 70.0 in | Wt 215.0 lb

## 2021-11-23 DIAGNOSIS — R1030 Lower abdominal pain, unspecified: Secondary | ICD-10-CM

## 2021-11-23 DIAGNOSIS — R350 Frequency of micturition: Secondary | ICD-10-CM | POA: Diagnosis not present

## 2021-11-23 DIAGNOSIS — R102 Pelvic and perineal pain: Secondary | ICD-10-CM

## 2021-11-23 DIAGNOSIS — R351 Nocturia: Secondary | ICD-10-CM

## 2021-11-23 MED ORDER — ALFUZOSIN HCL ER 10 MG PO TB24: 10.0000 mg | ORAL_TABLET | Freq: Every day | ORAL | 11 refills | Status: DC

## 2021-11-23 MED ORDER — AMOXICILLIN-POT CLAVULANATE 875-125 MG PO TABS: 1.0000 | ORAL_TABLET | Freq: Two times a day (BID) | ORAL | 0 refills | Status: DC

## 2021-11-24 LAB — URINALYSIS, ROUTINE W REFLEX MICROSCOPIC
Bilirubin, UA: NEGATIVE
Glucose, UA: NEGATIVE
Ketones, UA: NEGATIVE
Leukocytes,UA: NEGATIVE
Nitrite, UA: NEGATIVE
RBC, UA: NEGATIVE
Specific Gravity, UA: 1.02 (ref 1.005–1.030)
Urobilinogen, Ur: 0.2 mg/dL (ref 0.2–1.0)
pH, UA: 5.5 (ref 5.0–7.5)

## 2021-11-24 LAB — MICROSCOPIC EXAMINATION
Bacteria, UA: NONE SEEN
Epithelial Cells (non renal): NONE SEEN /hpf (ref 0–10)
RBC, Urine: NONE SEEN /hpf (ref 0–2)

## 2021-12-16 ENCOUNTER — Encounter: Payer: Self-pay | Admitting: Family Medicine

## 2021-12-16 ENCOUNTER — Ambulatory Visit: Payer: BC Managed Care – PPO | Admitting: Family Medicine

## 2021-12-16 VITALS — BP 118/68 | HR 62 | Temp 98.4°F | Ht 70.0 in | Wt 231.0 lb

## 2021-12-16 DIAGNOSIS — H6123 Impacted cerumen, bilateral: Secondary | ICD-10-CM | POA: Diagnosis not present

## 2021-12-16 DIAGNOSIS — R002 Palpitations: Secondary | ICD-10-CM

## 2021-12-16 DIAGNOSIS — R251 Tremor, unspecified: Secondary | ICD-10-CM

## 2021-12-16 DIAGNOSIS — R000 Tachycardia, unspecified: Secondary | ICD-10-CM | POA: Diagnosis not present

## 2021-12-16 NOTE — Progress Notes (Signed)
   Subjective:    Patient ID: Scott Lam, male    DOB: [birth year], 26 y.o.   MRN: [id number]  HPI  Pain/ irritation in both ears for past few months, hx of wax build up Using debrox  Patient with intermittent spells of his heart running fast He also feels like there is a tremor in his head that he notices when he lays down He denies any chest pressure tightness or shortness of breath States that the increased heart rate occurs when he stands up Other times feels like his heart may be having palpitations He is concerned about this and desires to see cardiology He does run on the weekends he lifts weight no passing out spells no history of premature heart disease  Patient also with frequent urinary issues sees urologist currently they tried Uroxatrol and that gave him reflex tachycardia issues he stopped taking the medicine but these issues have continued Review of Systems     Objective:   Physical Exam General-in no acute distress Eyes-no discharge Lungs-respiratory rate normal, CTA CV-no murmurs,RRR Extremities skin warm dry no edema Neuro grossly normal Behavior normal, alert Bilateral cerumen impaction noted Neurologic finger-to-nose is normal EOMI.  No tremors noted on exam. EKG looks good there is no acute changes     Assessment & Plan:  1. Bilateral impacted cerumen Irrigation was tried but unfortunately was not successful needs referral to ENT - TSH + free T4 - Basic Metabolic Panel - Magnesium  2. Palpitation EKG looks good.  Reassuring cardiac exam.  Referral to cardiology perhaps they would do telemetry to rule out other issues.  We will go ahead and check thyroid function and electrolytes. - EKG 12-Lead - Ambulatory referral to Cardiology - TSH + free T4 - Basic Metabolic Panel - Magnesium  3. Tremor He is experiencing this tremor at nighttime.  He denies excessive stress or depression.  Check thyroid function.  Find no evidence of underlying disease  at the moment Monitor closely  - TSH + free T4 - Basic Metabolic Panel - Magnesium  4. Reflex tachycardia Patient relates when he stands up he feels like his heart rate runs fast I checked his blood pressure sitting and standing there is some drop but not severe no tachycardia on today's exam.  Encouraged him to eat healthy and stay active as well as well-hydrated. - EKG 12-Lead - Ambulatory referral to Cardiology - TSH + free T4 - Basic Metabolic Panel - Magnesium  Referral for cerumen impaction unable to irrigate here-ENT referral

## 2021-12-17 LAB — BASIC METABOLIC PANEL
BUN/Creatinine Ratio: 13 (ref 9–20)
BUN: 13 mg/dL (ref 6–20)
CO2: 24 mmol/L (ref 20–29)
Calcium: 9.9 mg/dL (ref 8.7–10.2)
Chloride: 104 mmol/L (ref 96–106)
Creatinine, Ser: 1 mg/dL (ref 0.76–1.27)
Glucose: 93 mg/dL (ref 70–99)
Potassium: 5.1 mmol/L (ref 3.5–5.2)
Sodium: 142 mmol/L (ref 134–144)
eGFR: 107 mL/min/{1.73_m2} (ref >59)

## 2021-12-17 LAB — MAGNESIUM: Magnesium: 1.9 mg/dL (ref 1.6–2.3)

## 2021-12-17 LAB — TSH+FREE T4
Free T4: 1.25 ng/dL (ref 0.82–1.77)
TSH: 1.05 u[IU]/mL (ref 0.450–4.500)

## 2021-12-21 ENCOUNTER — Ambulatory Visit (INDEPENDENT_AMBULATORY_CARE_PROVIDER_SITE_OTHER): Payer: BC Managed Care – PPO | Admitting: Urology

## 2021-12-21 ENCOUNTER — Encounter: Payer: Self-pay | Admitting: Urology

## 2021-12-21 VITALS — BP 134/81 | HR 62

## 2021-12-21 DIAGNOSIS — R350 Frequency of micturition: Secondary | ICD-10-CM | POA: Diagnosis not present

## 2021-12-21 DIAGNOSIS — R102 Pelvic and perineal pain: Secondary | ICD-10-CM

## 2021-12-21 NOTE — Progress Notes (Signed)
   History of Present Illness: Wound of multiple visits for this 26 year old male with pelvic discomfort.  At last visit I sent him home with alfuzosin as well as a 10-day course of Augmentin.  He stopped the alfuzosin after about a week-he had a fair amount of drinking for his birthday and he said he had some heart irregularities.  That is being evaluated eventually by a cardiologist.  Still having similar pelvic symptoms.  Biggest issue that is that he wakes up a couple of times a night to urinate.  No daytime urinary complaints.  Past Medical History:  Diagnosis Date   Asthma    Eczema    Seasonal allergies     History reviewed. No pertinent surgical history.  Home Medications:  Allergies as of 12/21/2021   No Known Allergies      Medication List        Accurate as of December 21, 2021  3:29 PM. If you have any questions, ask your nurse or doctor.          alfuzosin 10 MG 24 hr tablet Commonly known as: UROXATRAL Take 1 tablet (10 mg total) by mouth daily with breakfast.   ipratropium 0.03 % nasal spray Commonly known as: ATROVENT Place 2 sprays into both nostrils 3 (three) times daily.   levocetirizine 5 MG tablet Commonly known as: XYZAL Take 1 tablet (5 mg total) by mouth every evening.        Allergies: No Known Allergies  Family History  Problem Relation Age of Onset   Healthy Mother    Healthy Father     Social History:  reports that he has never smoked. He has never used smokeless tobacco. He reports that he does not drink alcohol and does not use drugs.  ROS: A complete review of systems was performed.  All systems are negative except for pertinent findings as noted.  Physical Exam:  Vital signs in last 24 hours: BP 134/81   Pulse 62  Constitutional:  Alert and oriented, No acute distress Cardiovascular: Regular rate  Respiratory: Normal respiratory effort GI: Abdomen is soft, nontender, nondistended, no abdominal masses. No CVAT.   Genitourinary: Normal male phallus, testes are descended bilaterally and non-tender and without masses, scrotum is normal in appearance without lesions or masses, perineum is normal on inspection. Lymphatic: No lymphadenopathy Neurologic: Grossly intact, no focal deficits Psychiatric: Normal mood and affect  I have reviewed prior pt notes  I have reviewed urinalysis results  I have reviewed prior urine culture   Impression/Assessment:  Lower urinary tract symptoms-biggest issue is nocturia  Pelvic discomfort, both of these issues still present although he has had normal exams.  He may well have pelvic floor syndrome.  Plan:  1.  He will construct a voiding diary for 3 nights and get me the results  2.  I will contact him once I see that for further management  3.  I would suggest we eventually look towards pelvic floor therapy.

## 2021-12-22 LAB — MICROSCOPIC EXAMINATION
Bacteria, UA: NONE SEEN
Epithelial Cells (non renal): NONE SEEN /hpf (ref 0–10)
RBC, Urine: NONE SEEN /hpf (ref 0–2)
WBC, UA: NONE SEEN /hpf (ref 0–5)

## 2021-12-22 LAB — URINALYSIS, ROUTINE W REFLEX MICROSCOPIC
Bilirubin, UA: NEGATIVE
Glucose, UA: NEGATIVE
Ketones, UA: NEGATIVE
Leukocytes,UA: NEGATIVE
Nitrite, UA: NEGATIVE
RBC, UA: NEGATIVE
Specific Gravity, UA: 1.02 (ref 1.005–1.030)
Urobilinogen, Ur: 0.2 mg/dL (ref 0.2–1.0)
pH, UA: 6 (ref 5.0–7.5)

## 2022-01-18 ENCOUNTER — Ambulatory Visit: Payer: BC Managed Care – PPO | Attending: Internal Medicine | Admitting: Internal Medicine

## 2022-01-18 ENCOUNTER — Ambulatory Visit (INDEPENDENT_AMBULATORY_CARE_PROVIDER_SITE_OTHER): Payer: BC Managed Care – PPO

## 2022-01-18 ENCOUNTER — Telehealth: Payer: Self-pay | Admitting: Internal Medicine

## 2022-01-18 ENCOUNTER — Encounter: Payer: Self-pay | Admitting: Internal Medicine

## 2022-01-18 ENCOUNTER — Encounter: Payer: Self-pay | Admitting: *Deleted

## 2022-01-18 ENCOUNTER — Other Ambulatory Visit: Payer: Self-pay | Admitting: Internal Medicine

## 2022-01-18 VITALS — BP 116/70 | HR 60 | Ht 70.0 in | Wt 235.6 lb

## 2022-01-18 DIAGNOSIS — R002 Palpitations: Secondary | ICD-10-CM

## 2022-01-18 DIAGNOSIS — R079 Chest pain, unspecified: Secondary | ICD-10-CM

## 2022-01-18 NOTE — Patient Instructions (Addendum)
Medication Instructions:  Your physician recommends that you continue on your current medications as directed. Please refer to the Current Medication list given to you today.  Labwork: none  Testing/Procedures: Your physician has requested that you have an exercise tolerance test. For further information please visit [email or web address]. Please also follow instruction sheet, as given. Your physician has recommended that you wear a Zio monitor.   This monitor is a medical device that records the heart's electrical activity. Doctors most often use these monitors to diagnose arrhythmias. Arrhythmias are problems with the speed or rhythm of the heartbeat. The monitor is a small device applied to your chest. You can wear one while you do your normal daily activities. While wearing this monitor if you have any symptoms to push the button and record what you felt. Once you have worn this monitor for the period of time provider prescribed (for 7 days), you will return the monitor device in the postage paid box. Once it is returned they will download the data collected and provide [place] with a report which the provider will then review and we will call you with those results. Important tips:  Avoid showering during the first 24 hours of wearing the monitor. Avoid excessive sweating to help maximize wear time. Do not submerge the device, no hot tubs, and no swimming pools. Keep any lotions or oils away from the patch. After 24 hours you may shower with the patch on. Take brief showers with your back facing the shower head.  Do not remove patch once it has been placed because that will interrupt data and decrease adhesive wear time. Push the button when you have any symptoms and write down what you were feeling. Once you have completed wearing your monitor, remove and place into box which has postage paid and place in your outgoing mailbox.  If for some reason you have misplaced your box then call our office  and we can provide another box and/or mail it off for you.  Follow-Up: Your physician recommends that you schedule a follow-up appointment in: as needed  Any Other Special Instructions Will Be Listed Below (If Applicable).  If you need a refill on your cardiac medications before your next appointment, please call your pharmacy.

## 2022-01-18 NOTE — Progress Notes (Signed)
Cardiology Office Note  Date: 01/18/2022   ID: LYN DEEMER Lam, DOB [birth year], MRN [id number]  PCP:  Kathyrn Drown, MD  Cardiologist:  Chalmers Guest, MD Electrophysiologist:  None   Reason for Office Visit: Evaluation of palpitations at the request of Dr. Wolfgang Phoenix   History of Present Illness: Scott Lam is a 27 y.o. male with no PMH was referred to cardiology clinic for evaluation of palpitations.  Around his birthday on 12/03/2020, he drank a lot of alcohol followed by sudden onset of palpitations moving all across his body (describes it as pounding sensation). He stopped drinking coffee, stopped alfuzosin, and cut down on energy drinks but the palpitations continued. He has palpitations every day and all day including at nighttime. He also works out 3-4 times per week and does have palpitations which are regular with exercise and does not bother him. Even the resting palpitations do not affect his quality of life but wants to get checked out.  Associated with dizziness, chest pain and diaphoresis. The symptoms are also aggravated by stress and anxiety.  He also added that he has palpitations during the interview while talking to me as well.  Does not take any medications for anxiety.  Denies smoking cigarettes, alcohol use and illicit drug abuse. No family history of premature ASCVD.  Past Medical History:  Diagnosis Date   Asthma    Eczema    Seasonal allergies     Past Surgical History:  Procedure Laterality Date   NO PAST SURGERIES      Current Outpatient Medications  Medication Sig Dispense Refill   levocetirizine (XYZAL) 5 MG tablet Take 1 tablet (5 mg total) by mouth every evening. 30 tablet 0   ipratropium (ATROVENT) 0.03 % nasal spray Place 2 sprays into both nostrils 3 (three) times daily. (Patient not taking: Reported on 01/18/2022) 30 mL 0   No current facility-administered medications for this visit.   Allergies:  Patient has no known allergies.    Social History: The patient  reports that he has never smoked. He has never used smokeless tobacco. He reports that he does not drink alcohol and does not use drugs.   Family History: The patient's family history includes Healthy in his father and mother.   ROS:  Please see the history of present illness. Otherwise, complete review of systems is positive for none.  All other systems are reviewed and negative.   Physical Exam: VS:  BP 116/70   Pulse 60   Ht 5\' 10"  (1.778 m)   Wt 235 lb 9.6 oz (106.9 kg)   SpO2 99%   BMI 33.81 kg/m , BMI Body mass index is 33.81 kg/m.  Wt Readings from Last 3 Encounters:  01/18/22 235 lb 9.6 oz (106.9 kg)  12/16/21 231 lb (104.8 kg)  11/23/21 215 lb (97.5 kg)    General: Patient appears comfortable at rest. HEENT: Conjunctiva and lids normal, oropharynx clear with moist mucosa. Neck: Supple, no elevated JVP or carotid bruits, no thyromegaly. Lungs: Clear to auscultation, nonlabored breathing at rest. Cardiac: Regular rate and rhythm, no S3 or significant systolic murmur, no pericardial rub. Abdomen: Soft, nontender, no hepatomegaly, bowel sounds present, no guarding or rebound. Extremities: No pitting edema, distal pulses 2+. Skin: Warm and dry. Musculoskeletal: No kyphosis. Neuropsychiatric: Alert and oriented x3, affect grossly appropriate.  ECG: Normal sinus rhythm and no ST-T changes.  Incomplete right bundle branch block.  Recent Labwork: 08/17/2021: Hemoglobin 15.8; Platelets 251 08/26/2021:  ALT 15; AST 18 12/16/2021: BUN 13; Creatinine, Ser 1.00; Magnesium 1.9; Potassium 5.1; Sodium 142; TSH 1.050  No results found for: "CHOL", "TRIG", "HDL", "CHOLHDL", "VLDL", "LDLCALC", "LDLDIRECT"  Other Studies Reviewed Today:   Assessment and Plan: Patient is a 27 year old M with no PMH was referred to cardiology clinic for evaluation of palpitations.  # Palpitations -Patient has palpitations at rest which do not affect his quality of life.  He also experiences palpitations with exercise (works out 3 times per week) which are the usual palpitations that occurs with workouts and does not bother him. -Obtain 2-week event monitor -Avoid caffeine/energy drinks -Stress and anxiety management  # Chest pain -Obtain exercise tolerance test  I have spent a total of 41 minutes with patient reviewing chart , telemetry, EKGs, labs and examining patient as well as establishing an assessment and plan that was discussed with the patient.  > 50% of time was spent in direct patient care.  ]    Medication Adjustments/Labs and Tests Ordered: Current medicines are reviewed at length with the patient today.  Concerns regarding medicines are outlined above.   Tests Ordered: Orders Placed This Encounter  Procedures   EXERCISE TOLERANCE TEST (ETT)    Medication Changes: No orders of the defined types were placed in this encounter.   Disposition:  Follow up  PRN  Signed Xareni Kelch Fidel Levy, MD, 01/18/2022 10:54 AM    [facility], [place], [place] [address]

## 2022-01-18 NOTE — Telephone Encounter (Signed)
PERCERT:  7 Day ZIO XT/Mallipedd    GXT scheduled for Forestine Na 01-27-2022

## 2022-01-27 ENCOUNTER — Ambulatory Visit (HOSPITAL_COMMUNITY)
Admission: RE | Admit: 2022-01-27 | Discharge: 2022-01-27 | Disposition: A | Discharge status: Home / Self Care | Payer: BC Managed Care – PPO | Source: Ambulatory Visit | Attending: Internal Medicine | Admitting: Internal Medicine

## 2022-01-27 DIAGNOSIS — R079 Chest pain, unspecified: Secondary | ICD-10-CM | POA: Insufficient documentation

## 2022-01-27 LAB — EXERCISE TOLERANCE TEST
Angina Index: 0
Base ST Depression (mm): 0 mm
Duke Treadmill Score: 8
Exercise duration (min): 8 min
Exercise duration (sec): 1 s
MPHR: 195 {beats}/min
Peak HR: 173 {beats}/min
Percent HR: 88 %
RPE: 11
Rest HR: 64 {beats}/min
ST Depression (mm): 0 mm

## 2022-01-27 NOTE — Addendum Note (Signed)
Encounter addended by: Dwana Melena, RN on: 01/27/2022 2:55 PM  Actions taken: Imaging Exam ended

## 2022-03-02 DIAGNOSIS — H6123 Impacted cerumen, bilateral: Secondary | ICD-10-CM | POA: Diagnosis not present

## 2022-03-18 DIAGNOSIS — M5440 Lumbago with sciatica, unspecified side: Secondary | ICD-10-CM | POA: Diagnosis not present

## 2022-03-18 DIAGNOSIS — S39012A Strain of muscle, fascia and tendon of lower back, initial encounter: Secondary | ICD-10-CM | POA: Diagnosis not present

## 2022-03-28 ENCOUNTER — Encounter: Payer: Self-pay | Admitting: Internal Medicine

## 2022-03-28 NOTE — Telephone Encounter (Signed)
Error

## 2022-04-07 ENCOUNTER — Telehealth: Payer: Self-pay | Admitting: Internal Medicine

## 2022-04-07 NOTE — Telephone Encounter (Signed)
Pt returning call

## 2022-04-07 NOTE — Telephone Encounter (Signed)
Patient c/o Palpitations:  High priority if patient c/o lightheadedness, shortness of breath, or chest pain  How long have you had palpitations/irregular HR/ Afib? Are you having the symptoms now? Intense heart beat, feels like it is beating in other parts of his body  Are you currently experiencing lightheadedness, SOB or CP?  Chest pains off and on  Do you have a history of afib (atrial fibrillation) or irregular heart rhythm?   Have you checked your BP or HR? (document readings if available): have not checked it in awhile   Are you experiencing any other symptoms? - patient wanted to be seen.I made him an appointment for Monday with Dr Dellia Cloud- please call  to triage

## 2022-04-08 NOTE — Telephone Encounter (Signed)
Requesting a new appointment for prior symptoms that he was seen for in January. Reports chest pain is worse than before. Reports that he is currently at work. Reports havin short, sharp intermittent chest pains rated 7/10. Denies active chest pain. Reports having an intense heart beat. Reports this is unchanged from previous complaint of intense heart beat. Describes it as feeling his heart beat in hands, neck and head. Says he feels a pulsation in his head with his heart beat. Denies dizziness or sob. Does not check BP or HR. Says he has not contacted his PCP with these symptoms. Advised that his heart monitor and stress test was normal. Appointment given to patient by scheduler for 04/11/2022 @2 :00 pm. Advised if he develops worsening symptoms, to go to the ED for an evaluation. Verbalized understanding of plan.

## 2022-04-08 NOTE — Telephone Encounter (Signed)
Mychart message sent advising patient to contact his PCP for non-cardiac causes of his symptoms

## 2022-04-08 NOTE — Telephone Encounter (Signed)
Patient informed and verbalized understanding of plan. 

## 2022-04-11 ENCOUNTER — Ambulatory Visit: Payer: BC Managed Care – PPO | Admitting: Internal Medicine

## 2022-05-23 ENCOUNTER — Other Ambulatory Visit: Payer: Self-pay | Admitting: *Deleted

## 2022-05-23 ENCOUNTER — Ambulatory Visit: Payer: BC Managed Care – PPO | Admitting: Family Medicine

## 2022-05-23 VITALS — BP 132/72 | Ht 70.0 in | Wt 249.0 lb

## 2022-05-23 DIAGNOSIS — R079 Chest pain, unspecified: Secondary | ICD-10-CM | POA: Diagnosis not present

## 2022-05-23 DIAGNOSIS — M6740 Ganglion, unspecified site: Secondary | ICD-10-CM

## 2022-05-23 DIAGNOSIS — R200 Anesthesia of skin: Secondary | ICD-10-CM | POA: Diagnosis not present

## 2022-05-23 DIAGNOSIS — E875 Hyperkalemia: Secondary | ICD-10-CM

## 2022-05-23 DIAGNOSIS — M67431 Ganglion, right wrist: Secondary | ICD-10-CM

## 2022-05-23 NOTE — Progress Notes (Signed)
   Subjective:    Patient ID: Scott Lam, male    DOB: July 18, 1995, 27 y.o.   MRN: [id number]  HPI  Patient states he was seen in November for heart symptoms and was referred to cardiology.He went to cardiology because he can feel his pulse intensely in his whole body and chest pains. He states cardiology did a stress test and heart monitor and other cardiac test and it was all normal and cardiology will not see him anymore -told him he had to come to PCP to discuss other alternatives for his continued symptoms. Patient describes at times he feels like there is a tremor in his chest when his heart is beating hard he states it is not beating fast it is a atypical discomfort.  No shortness of breath no wheezing saw cardiology they did a stress test they also did telemetry and these were both negative-their office instructed him to follow-up with [place] Review of Systems     Objective:   Physical Exam  General-in no acute distress Eyes-no discharge Lungs-respiratory rate normal, CTA CV-no murmurs,RRR Extremities skin warm dry no edema Neuro grossly normal Behavior normal, alert       Assessment & Plan:  1. Chest pain of uncertain etiology So far his cardiac workup is negative He does relate atypical chest pains We will do an echo to make sure the structure of the heart looks good As for feeling his heart being dynamic and feeling this in his various pulses I would recommend that this could be just how his body is not necessarily a sign of underlying illness His thyroid testing was good and cardiac testing was good  2. Ganglion cyst Ganglion cyst left wrist referral to Dr. Romeo Apple  3. Arm numbness Intermittent short numbness could be impingement of the cervical nerve could be carpal tunnel left side no need to do any type of intervention currently.  This is very brief happens only occasionally after shared discussion patient defers on nerve conduction studies

## 2022-05-31 ENCOUNTER — Encounter: Payer: Self-pay | Admitting: Orthopedic Surgery

## 2022-05-31 ENCOUNTER — Other Ambulatory Visit (INDEPENDENT_AMBULATORY_CARE_PROVIDER_SITE_OTHER): Payer: BC Managed Care – PPO

## 2022-05-31 ENCOUNTER — Ambulatory Visit: Payer: BC Managed Care – PPO | Admitting: Orthopedic Surgery

## 2022-05-31 VITALS — BP 134/81 | HR 60 | Ht 71.0 in | Wt 249.0 lb

## 2022-05-31 DIAGNOSIS — M25532 Pain in left wrist: Secondary | ICD-10-CM | POA: Diagnosis not present

## 2022-05-31 DIAGNOSIS — M67432 Ganglion, left wrist: Secondary | ICD-10-CM

## 2022-05-31 NOTE — Addendum Note (Signed)
Addended by: Margaretha Sheffield on: 05/31/2022 11:42 AM   Modules accepted: Orders

## 2022-05-31 NOTE — Progress Notes (Signed)
New Patient Visit  Assessment: Scott Lam is a 27 y.o. male with the following: 1. Ganglion cyst of dorsum of left wrist  Plan: Jeffie Pollock Lam has some pain and swelling on the dorsal left wrist.  He reports an injury couple years ago.  Since then, he has had some swelling in the dorsal wrist.  He notes some swelling in this area.  He is able to use his wrist normally, but feels some discomfort in this area when he is trying to do some push-ups, or push something heavy.  On physical exam, I can appreciate some swelling and fullness, which is consistent with the location of the ganglion cyst.  He does have some discomfort with full extension of the wrist.  Radiographs are negative for any acute injury.  It is conceivable that he has a ganglion cyst, but due to its size, I would recommend against further treatment at this time.  If it continues to swell, and get larger, we can consider aspiration.  If it does continue to bother him, we could pursue some advanced imaging or an ultrasound.  If his wrist becomes painful with activity, I recommended medicine such as ibuprofen or naproxen.  He states his understanding.  He is in agreement with this plan.  If nothing else, I provided him with reassurance.  He can follow-up in clinic as needed.  Follow-up: Return if symptoms worsen or fail to improve.  Subjective:  Chief Complaint  Patient presents with   Wrist Injury    At work 2 years ago states has had a knot for some time no recent xray s    History of Present Illness: Scott Lam is a 27 y.o. male who has been referred by Johnnye Lana, MD for evaluation of left wrist pain.  He states he fell and injured his left wrist a couple years ago.  Radiographs are negative at that time.  The pain progressively improved, but did not completely resolved.  He notes that he has some swelling in this area.  He notes pain with full extension of the wrist, especially when he is doing push-ups, or  pushing heavy objects.  No redness in this area.  He does not take any medications.  He has not sought further treatment until now.   Review of Systems: No fevers or chills No numbness or tingling No chest pain No shortness of breath No bowel or bladder dysfunction No GI distress No headaches   Medical History:  Past Medical History:  Diagnosis Date   Asthma    Eczema    Seasonal allergies     Past Surgical History:  Procedure Laterality Date   NO PAST SURGERIES      Family History  Problem Relation Age of Onset   Healthy Mother    Healthy Father    Social History   Tobacco Use   Smoking status: Never   Smokeless tobacco: Never  Vaping Use   Vaping Use: Never used  Substance Use Topics   Alcohol use: No   Drug use: No    No Known Allergies  Current Meds  Medication Sig   levocetirizine (XYZAL) 5 MG tablet Take 1 tablet (5 mg total) by mouth every evening.    Objective: BP 134/81   Pulse 60   Ht 5\' 11"  (1.803 m)   Wt 249 lb (112.9 kg)   BMI 34.73 kg/m   Physical Exam:  General: Alert and oriented. and No acute distress.  Gait: Normal gait.  Evaluation of the left wrist demonstrates no obvious deformity.  With full flexion of the wrist, I can appreciate some swelling and fullness in the wrist joint.  There is no obvious cyst formation.  No capsule is appreciated.  He has good strength in the hand.  Sensation is intact throughout the left hand.  2+ radial pulse.  IMAGING: I personally ordered and reviewed the following images  X-rays of the left wrist were obtained in clinic today.  No acute injuries are noted.  No evidence of chronic injury.  No bone spurs.  No dislocation.  No bone cysts.  SL interval is normal.  No bony lesions.  Impression: Negative left wrist x-ray.   New Medications:  No orders of the defined types were placed in this encounter.     Oliver Barre, MD  05/31/2022 4:37 PM

## 2022-06-08 DIAGNOSIS — R079 Chest pain, unspecified: Secondary | ICD-10-CM | POA: Diagnosis not present

## 2022-06-08 DIAGNOSIS — E875 Hyperkalemia: Secondary | ICD-10-CM | POA: Diagnosis not present

## 2022-06-09 LAB — CBC WITH DIFFERENTIAL/PLATELET
Basophils Absolute: 0 10*3/uL (ref 0.0–0.2)
Basos: 0 %
EOS (ABSOLUTE): 0.1 10*3/uL (ref 0.0–0.4)
Eos: 2 %
Hematocrit: 45.9 % (ref 37.5–51.0)
Hemoglobin: 14.8 g/dL (ref 13.0–17.7)
Immature Grans (Abs): 0 10*3/uL (ref 0.0–0.1)
Immature Granulocytes: 0 %
Lymphocytes Absolute: 2.2 10*3/uL (ref 0.7–3.1)
Lymphs: 35 %
MCH: 29.7 pg (ref 26.6–33.0)
MCHC: 32.2 g/dL (ref 31.5–35.7)
MCV: 92 fL (ref 79–97)
Monocytes Absolute: 0.6 10*3/uL (ref 0.1–0.9)
Monocytes: 9 %
Neutrophils Absolute: 3.4 10*3/uL (ref 1.4–7.0)
Neutrophils: 54 %
Platelets: 235 10*3/uL (ref 150–450)
RBC: 4.99 x10E6/uL (ref 4.14–5.80)
RDW: 12.7 % (ref 11.6–15.4)
WBC: 6.3 10*3/uL (ref 3.4–10.8)

## 2022-06-09 LAB — LIPID PANEL
Chol/HDL Ratio: 2.9 ratio (ref 0.0–5.0)
Cholesterol, Total: 151 mg/dL (ref 100–199)
HDL: 52 mg/dL (ref >39)
LDL Chol Calc (NIH): 86 mg/dL (ref 0–99)
Triglycerides: 62 mg/dL (ref 0–149)
VLDL Cholesterol Cal: 13 mg/dL (ref 5–40)

## 2022-06-09 LAB — CMP14+EGFR
ALT: 20 IU/L (ref 0–44)
AST: 21 IU/L (ref 0–40)
Albumin/Globulin Ratio: 1.5 (ref 1.2–2.2)
Albumin: 4.6 g/dL (ref 4.3–5.2)
Alkaline Phosphatase: 58 IU/L (ref 44–121)
BUN/Creatinine Ratio: 13 (ref 9–20)
BUN: 17 mg/dL (ref 6–20)
Bilirubin Total: 0.8 mg/dL (ref 0.0–1.2)
CO2: 24 mmol/L (ref 20–29)
Calcium: 9.9 mg/dL (ref 8.7–10.2)
Chloride: 102 mmol/L (ref 96–106)
Creatinine, Ser: 1.27 mg/dL (ref 0.76–1.27)
Globulin, Total: 3.1 g/dL (ref 1.5–4.5)
Glucose: 82 mg/dL (ref 70–99)
Potassium: 4.9 mmol/L (ref 3.5–5.2)
Sodium: 140 mmol/L (ref 134–144)
Total Protein: 7.7 g/dL (ref 6.0–8.5)
eGFR: 80 mL/min/{1.73_m2} (ref >59)

## 2022-06-14 ENCOUNTER — Telehealth: Payer: Self-pay | Admitting: Family Medicine

## 2022-06-14 NOTE — Telephone Encounter (Signed)
Checked [employer] for precert for Echo:   "The following solutions for the service date entered do not require Pre-Authorization by [employer]." I have scheduled the Echo at [facility] 08/16/22 8am - patient has my-chart also called no voice mail.   Sent my-chart message -   You have a Echo scheduled at [facility] 08/16/22 8am ordered by Dr Koren Shiver. Voice mail not set up

## 2022-07-28 ENCOUNTER — Ambulatory Visit (HOSPITAL_COMMUNITY)
Admission: RE | Admit: 2022-07-28 | Discharge: 2022-07-28 | Disposition: A | Discharge status: Home / Self Care | Payer: BC Managed Care – PPO | Source: Ambulatory Visit | Attending: Family Medicine | Admitting: Family Medicine

## 2022-07-28 DIAGNOSIS — R079 Chest pain, unspecified: Secondary | ICD-10-CM | POA: Diagnosis not present

## 2022-07-28 LAB — ECHOCARDIOGRAM COMPLETE
Area-P 1/2: 3.85 cm2
S' Lateral: 2.9 cm

## 2022-07-28 NOTE — Progress Notes (Signed)
*  PRELIMINARY RESULTS* Echocardiogram 2D Echocardiogram has been performed.  Stacey Drain 07/28/2022, 9:14 AM

## 2022-08-03 ENCOUNTER — Ambulatory Visit (INDEPENDENT_AMBULATORY_CARE_PROVIDER_SITE_OTHER): Payer: BC Managed Care – PPO | Admitting: Family Medicine

## 2022-08-03 VITALS — BP 115/75 | HR 57 | Ht 70.75 in | Wt 244.2 lb

## 2022-08-03 DIAGNOSIS — R350 Frequency of micturition: Secondary | ICD-10-CM | POA: Insufficient documentation

## 2022-08-03 DIAGNOSIS — Z23 Encounter for immunization: Secondary | ICD-10-CM | POA: Diagnosis not present

## 2022-08-03 DIAGNOSIS — Z0001 Encounter for general adult medical examination with abnormal findings: Secondary | ICD-10-CM

## 2022-08-03 DIAGNOSIS — Z0000 Encounter for general adult medical examination without abnormal findings: Secondary | ICD-10-CM

## 2022-08-03 LAB — POCT URINALYSIS DIP (CLINITEK)
Blood, UA: NEGATIVE
Glucose, UA: NEGATIVE mg/dL
Ketones, POC UA: NEGATIVE mg/dL
Leukocytes, UA: NEGATIVE
Nitrite, UA: NEGATIVE
POC PROTEIN,UA: 30 — AB
Spec Grav, UA: >=1.03 — AB (ref 1.010–1.025)
Urobilinogen, UA: 0.2 E.U./dL
pH, UA: 6 (ref 5.0–8.0)

## 2022-08-03 NOTE — Progress Notes (Signed)
   Subjective:    Patient ID: Scott Lam, male    DOB: 10/05/1995, 27 y.o.   MRN: [id number]  HPI The patient comes in today for a wellness visit.    A review of their health history was completed.  A review of medications was also completed.  Any needed refills; no  Eating habits: fair  Falls/  MVA accidents in past few months: no  Regular exercise: yes, 20 min walks push ups   Specialist pt sees on regular basis: no  Preventative health issues were discussed.   Additional concerns: patient would like to discuss left hip sharp pain (mostly aggravated when sitting)    Patient denies abdominal pain with this.  Is just more of a sharp pain that sometimes hits him in the left lower area and sometimes middle and lower area.  He has problems with urinary frequency during the day and also at nighttime he gets the urge to urinate has to get up 2 or 3 times his blood work is looked normal he is seeing urology which unfortunately did not figure out what is going on.  He has ongoing troubles.  Denies hematuria denies vomiting of the abdominal pain does not have any severe pain that wakes him up at night bowel movements are okay Review of Systems     Objective:   Physical Exam General-in no acute distress Eyes-no discharge Lungs-respiratory rate normal, CTA CV-no murmurs,RRR Extremities skin warm dry no edema Neuro grossly normal Behavior normal, alert Extremities no edema skin warm dry       Assessment & Plan:   1. Urinary frequency We will go ahead with consultation with urology I recommend urology with [facility] for further evaluation possible interstitial cystitis?  Previous lab work including kidney functions were acceptable - Ambulatory referral to Urology - POCT URINALYSIS DIP (CLINITEK) - Microalbumin/Creatinine Ratio, Urine  2. Well adult exam Adult wellness-complete.wellness physical was conducted today. Importance of diet and exercise were discussed in  detail.  Importance of stress reduction and healthy living were discussed.  In addition to this a discussion regarding safety was also covered.  We also reviewed over immunizations and gave recommendations regarding current immunization needed for age.   In addition to this additional areas were also touched on including: Preventative health exams needed:  Colonoscopy not indicated, Tdap today  Patient was advised yearly wellness exam   3. Need for vaccination Vaccine today - Tdap vaccine greater than or equal to 7yo IM I do not find any sign of the abdominal tenderness on exam I would not recommend a CAT scan I feel this is probably related to the urologic issue we will go ahead and refer if he gets worse to follow-up

## 2022-08-04 LAB — MICROALBUMIN / CREATININE URINE RATIO
Creatinine, Urine: 300 mg/dL
Microalb/Creat Ratio: 3 mg/g creat (ref 0–29)
Microalbumin, Urine: 8.7 ug/mL

## 2022-09-28 ENCOUNTER — Encounter: Payer: Self-pay | Admitting: Family Medicine

## 2022-09-28 ENCOUNTER — Ambulatory Visit (INDEPENDENT_AMBULATORY_CARE_PROVIDER_SITE_OTHER): Payer: BC Managed Care – PPO | Admitting: Family Medicine

## 2022-09-28 VITALS — BP 123/68 | HR 63 | Temp 98.2°F | Wt 233.4 lb

## 2022-09-28 DIAGNOSIS — J029 Acute pharyngitis, unspecified: Secondary | ICD-10-CM | POA: Diagnosis not present

## 2022-09-28 LAB — POCT RAPID STREP A (OFFICE): Rapid Strep A Screen: NEGATIVE

## 2022-09-28 MED ORDER — PREDNISONE 10 MG (21) PO TBPK: ORAL_TABLET | ORAL | 0 refills | Status: DC

## 2022-09-28 MED ORDER — TRIAMCINOLONE ACETONIDE 55 MCG/ACT NA AERO: 2.0000 | INHALATION_SPRAY | Freq: Every day | NASAL | 12 refills | Status: AC

## 2022-09-28 NOTE — Patient Instructions (Signed)
Medications as directed.  If persists, please let us know.  Awaiting culture.

## 2022-09-28 NOTE — Assessment & Plan Note (Signed)
Persistent problem over the past 2 months.  Rapid strep negative.  Awaiting culture.  Given persistence and severity, placing on prednisone.  Nasacort 2 treat potential underlying postnasal drip.  Offered ENT referral, patient states that he will wait to see if he improves with treatment prescribed today.

## 2022-09-28 NOTE — Progress Notes (Signed)
Subjective:  Patient ID: Scott Lam, male    DOB: Feb 16, 1995  Age: 27 y.o. MRN: [id number]  CC: Sore throat   HPI:  27 year old male presents for sore throat.  Patient reports that he has had an intermittent sore throat over the past 2 months.  He states that he is able to eat and drink but it is uncomfortable/painful.  He otherwise feels well.  He states that he does not feel sick.  No fever.  No cough, congestion, or other respiratory symptoms.  No relieving factors.  Denies GERD symptoms.  Patient Active Problem List   Diagnosis Date Noted   Sore throat 09/28/2022   Palpitations 01/18/2022   Asthma 06/09/2014   Allergic rhinitis 10/02/2012    Social Hx   Social History   Socioeconomic History   Marital status: Single    Spouse name: Not on file   Number of children: Not on file   Years of education: Not on file   Highest education level: GED or equivalent  Occupational History   Not on file  Tobacco Use   Smoking status: Never   Smokeless tobacco: Never  Vaping Use   Vaping status: Never Used  Substance and Sexual Activity   Alcohol use: No   Drug use: No   Sexual activity: Not on file  Other Topics Concern   Not on file  Social History Narrative   Not on file   Social Determinants of Health   Financial Resource Strain: Low Risk  (05/22/2022)   Overall Financial Resource Strain (CARDIA)    Difficulty of Paying Living Expenses: Not hard at all  Food Insecurity: No Food Insecurity (05/22/2022)   Hunger Vital Sign    Worried About Running Out of Food in the Last Year: Never true    Ran Out of Food in the Last Year: Never true  Transportation Needs: No Transportation Needs (05/22/2022)   PRAPARE - Administrator, Civil Service (Medical): No    Lack of Transportation (Non-Medical): No  Physical Activity: Insufficiently Active (05/22/2022)   Exercise Vital Sign    Days of Exercise per Week: 3 days    Minutes of Exercise per Session: 40 min   Stress: Stress Concern Present (05/22/2022)   [employer] of Occupational Health - Occupational Stress Questionnaire    Feeling of Stress : To some extent  Social Connections: Socially Isolated (05/22/2022)   Social Connection and Isolation Panel [NHANES]    Frequency of Communication with Friends and Family: More than three times a week    Frequency of Social Gatherings with Friends and Family: Three times a week    Attends Religious Services: Never    Active Member of Clubs or Organizations: No    Attends Engineer, structural: Not on file    Marital Status: Never married    Review of Systems Per HPI  Objective:  BP 123/68   Pulse 63   Temp 98.2 F (36.8 C)   Wt 233 lb 6.4 oz (105.9 kg)   SpO2 99%   BMI 32.78 kg/m      09/28/2022    3:58 PM 08/03/2022    2:34 PM 05/31/2022    4:17 PM  BP/Weight  Systolic BP 123 115 134  Diastolic BP 68 75 81  Wt. (Lbs) 233.4 244.2 249  BMI 32.78 kg/m2 34.3 kg/m2 34.73 kg/m2    Physical Exam Vitals and nursing note reviewed.  Constitutional:      General: He  is not in acute distress.    Appearance: Normal appearance.  HENT:     Head: Normocephalic and atraumatic.     Right Ear: Tympanic membrane normal.     Left Ear: Tympanic membrane normal.     Mouth/Throat:     Comments: Oropharyngeal erythema.  No tonsillar exudate.  Cobblestoning in the posterior oropharynx noted. Cardiovascular:     Rate and Rhythm: Normal rate and regular rhythm.  Musculoskeletal:     Cervical back: Neck supple.  Lymphadenopathy:     Cervical: No cervical adenopathy.  Neurological:     Mental Status: He is alert.     Lab Results  Component Value Date   WBC 6.3 06/08/2022   HGB 14.8 06/08/2022   HCT 45.9 06/08/2022   PLT 235 06/08/2022   GLUCOSE 82 06/08/2022   CHOL 151 06/08/2022   TRIG 62 06/08/2022   HDL 52 06/08/2022   LDLCALC 86 06/08/2022   ALT 20 06/08/2022   AST 21 06/08/2022   NA 140 06/08/2022   K 4.9 06/08/2022   CL  102 06/08/2022   CREATININE 1.27 06/08/2022   BUN 17 06/08/2022   CO2 24 06/08/2022   TSH 1.050 12/16/2021     Assessment & Plan:   Problem List Items Addressed This Visit       Other   Sore throat - Primary    Persistent problem over the past 2 months.  Rapid strep negative.  Awaiting culture.  Given persistence and severity, placing on prednisone.  Nasacort 2 treat potential underlying postnasal drip.  Offered ENT referral, patient states that he will wait to see if he improves with treatment prescribed today.      Relevant Orders   Culture, Group A Strep   POCT rapid strep A    Meds ordered this encounter  Medications   triamcinolone (NASACORT) 55 MCG/ACT AERO nasal inhaler    Sig: Place 2 sprays into the nose daily.    Dispense:  1 each    Refill:  12   predniSONE (STERAPRED UNI-PAK 21 TAB) 10 MG (21) TBPK tablet    Sig: 6 tablets on day 1; decrease by 1 tablet daily until gone.    Dispense:  21 tablet    Refill:  0    Follow-up:  Return if symptoms worsen or fail to improve.  [place] Other DO [facility]

## 2022-10-01 LAB — SPECIMEN STATUS REPORT

## 2022-10-01 LAB — CULTURE, GROUP A STREP: Strep A Culture: NEGATIVE

## 2022-10-17 DIAGNOSIS — R070 Pain in throat: Secondary | ICD-10-CM | POA: Diagnosis not present

## 2022-10-17 DIAGNOSIS — J028 Acute pharyngitis due to other specified organisms: Secondary | ICD-10-CM | POA: Diagnosis not present

## 2022-10-18 ENCOUNTER — Other Ambulatory Visit: Payer: Self-pay

## 2022-10-18 ENCOUNTER — Ambulatory Visit: Payer: BC Managed Care – PPO | Admitting: Family Medicine

## 2022-10-18 VITALS — BP 114/70 | HR 66 | Temp 98.1°F | Ht 70.75 in | Wt 230.0 lb

## 2022-10-18 DIAGNOSIS — J029 Acute pharyngitis, unspecified: Secondary | ICD-10-CM

## 2022-10-18 MED ORDER — NYSTATIN 100000 UNIT/ML MT SUSP: 5.0000 mL | Freq: Three times a day (TID) | OROMUCOSAL | 0 refills | Status: DC | PRN

## 2022-10-18 MED ORDER — ACYCLOVIR 400 MG PO TABS: 400.0000 mg | ORAL_TABLET | Freq: Three times a day (TID) | ORAL | 0 refills | Status: AC

## 2022-10-18 MED ORDER — NYSTATIN 100000 UNIT/ML MT SUSP: 5.0000 mL | Freq: Three times a day (TID) | OROMUCOSAL | 0 refills | Status: AC | PRN

## 2022-10-18 NOTE — Patient Instructions (Signed)
Lots of fluids.  Medication as prescribed.  Awaiting culture.

## 2022-10-19 DIAGNOSIS — J029 Acute pharyngitis, unspecified: Secondary | ICD-10-CM | POA: Insufficient documentation

## 2022-10-19 NOTE — Assessment & Plan Note (Addendum)
Likely viral given ulcerations noted on exam.  Placing empirically on acyclovir. Rx sent for Magic mouthwash. Waiting culture results.

## 2022-10-19 NOTE — Progress Notes (Signed)
Subjective:  Patient ID: Scott Lam, male    DOB: [birth year]  Age: 27 y.o. MRN: [id number]  CC: Chief Complaint  Patient presents with   Sore Throat    4 to 5 days     HPI:  27 year old male presents with sore throat.  Patient recently seen on 9/11.  He has had ongoing issues with recurrent sore throat.  Rapid strep was negative and culture was negative at last visit.  ENT referral was offered.  He wanted to wait.  I placed him on prednisone.  He reports that he had improvement in his sore throat but then sore throat has yet again recurred.  Patient states that he now has some pain in the palate as well.  No fever.  No other associated symptoms.  Patient Active Problem List   Diagnosis Date Noted   Pharyngitis 10/19/2022   Asthma 06/09/2014   Allergic rhinitis 10/02/2012    Social Hx   Social History   Socioeconomic History   Marital status: Single    Spouse name: Not on file   Number of children: Not on file   Years of education: Not on file   Highest education level: GED or equivalent  Occupational History   Not on file  Tobacco Use   Smoking status: Never   Smokeless tobacco: Never  Vaping Use   Vaping status: Never Used  Substance and Sexual Activity   Alcohol use: No   Drug use: No   Sexual activity: Not on file  Other Topics Concern   Not on file  Social History Narrative   Not on file   Social Determinants of Health   Financial Resource Strain: Low Risk  (05/22/2022)   Overall Financial Resource Strain (CARDIA)    Difficulty of Paying Living Expenses: Not hard at all  Food Insecurity: No Food Insecurity (05/22/2022)   Hunger Vital Sign    Worried About Running Out of Food in the Last Year: Never true    Ran Out of Food in the Last Year: Never true  Transportation Needs: No Transportation Needs (05/22/2022)   PRAPARE - Administrator, Civil Service (Medical): No    Lack of Transportation (Non-Medical): No  Physical Activity:  Insufficiently Active (05/22/2022)   Exercise Vital Sign    Days of Exercise per Week: 3 days    Minutes of Exercise per Session: 40 min  Stress: Stress Concern Present (05/22/2022)   [employer] of Occupational Health - Occupational Stress Questionnaire    Feeling of Stress : To some extent  Social Connections: Socially Isolated (05/22/2022)   Social Connection and Isolation Panel [NHANES]    Frequency of Communication with Friends and Family: More than three times a week    Frequency of Social Gatherings with Friends and Family: Three times a week    Attends Religious Services: Never    Active Member of Clubs or Organizations: No    Attends Engineer, structural: Not on file    Marital Status: Never married    Review of Systems Per HPI  Objective:  BP 114/70   Pulse 66   Temp 98.1 F (36.7 C)   Ht 5' 10.75" (1.797 m)   Wt 230 lb (104.3 kg)   SpO2 99%   BMI 32.31 kg/m      10/18/2022    3:16 PM 09/28/2022    3:58 PM 08/03/2022    2:34 PM  BP/Weight  Systolic BP 114 123 115  Diastolic BP 70 68 75  Wt. (Lbs) 230 233.4 244.2  BMI 32.31 kg/m2 32.78 kg/m2 34.3 kg/m2    Physical Exam Constitutional:      General: He is not in acute distress.    Appearance: Normal appearance.  HENT:     Mouth/Throat:     Comments: Oropharynx with mild erythema.  There are a few scattered ulcerations noted. Cardiovascular:     Rate and Rhythm: Normal rate and regular rhythm.  Pulmonary:     Effort: Pulmonary effort is normal. No respiratory distress.  Neurological:     Mental Status: He is alert.  Psychiatric:        Mood and Affect: Mood normal.        Behavior: Behavior normal.     Lab Results  Component Value Date   WBC 6.3 06/08/2022   HGB 14.8 06/08/2022   HCT 45.9 06/08/2022   PLT 235 06/08/2022   GLUCOSE 82 06/08/2022   CHOL 151 06/08/2022   TRIG 62 06/08/2022   HDL 52 06/08/2022   LDLCALC 86 06/08/2022   ALT 20 06/08/2022   AST 21 06/08/2022   NA 140  06/08/2022   K 4.9 06/08/2022   CL 102 06/08/2022   CREATININE 1.27 06/08/2022   BUN 17 06/08/2022   CO2 24 06/08/2022   TSH 1.050 12/16/2021     Assessment & Plan:   Problem List Items Addressed This Visit       Respiratory   Pharyngitis - Primary    Likely viral given ulcerations noted on exam.  Placing empirically on acyclovir. Rx sent for Magic mouthwash. Waiting culture results.      Relevant Orders   Anaerobic and Aerobic Culture    Meds ordered this encounter  Medications   DISCONTD: magic mouthwash (nystatin, diphenhydrAMINE, alum & mag hydroxide) suspension mixture    Sig: Swish and spit 5 mLs 3 (three) times daily as needed for mouth pain.    Dispense:  240 mL    Refill:  0    1:1:1   acyclovir (ZOVIRAX) 400 MG tablet    Sig: Take 1 tablet (400 mg total) by mouth 3 (three) times daily for 7 days.    Dispense:  21 tablet    Refill:  0    Follow-up:  Return if symptoms worsen or fail to improve.  Everlene Other DO [facility]

## 2022-10-22 LAB — ANAEROBIC AND AEROBIC CULTURE

## 2022-10-23 ENCOUNTER — Other Ambulatory Visit: Payer: Self-pay | Admitting: Family Medicine

## 2022-10-23 MED ORDER — AMOXICILLIN-POT CLAVULANATE 875-125 MG PO TABS: 1.0000 | ORAL_TABLET | Freq: Two times a day (BID) | ORAL | 0 refills | Status: AC

## 2022-12-13 DIAGNOSIS — R351 Nocturia: Secondary | ICD-10-CM | POA: Diagnosis not present

## 2023-02-08 DIAGNOSIS — K121 Other forms of stomatitis: Secondary | ICD-10-CM | POA: Diagnosis not present

## 2023-02-08 DIAGNOSIS — K1230 Oral mucositis (ulcerative), unspecified: Secondary | ICD-10-CM | POA: Diagnosis not present

## 2023-02-08 DIAGNOSIS — R1310 Dysphagia, unspecified: Secondary | ICD-10-CM | POA: Diagnosis not present

## 2023-02-27 DIAGNOSIS — H6123 Impacted cerumen, bilateral: Secondary | ICD-10-CM | POA: Diagnosis not present

## 2023-03-13 DIAGNOSIS — K219 Gastro-esophageal reflux disease without esophagitis: Secondary | ICD-10-CM | POA: Diagnosis not present

## 2023-03-13 DIAGNOSIS — K1379 Other lesions of oral mucosa: Secondary | ICD-10-CM | POA: Diagnosis not present

## 2023-04-11 DIAGNOSIS — K121 Other forms of stomatitis: Secondary | ICD-10-CM | POA: Diagnosis not present

## 2023-04-11 DIAGNOSIS — K1379 Other lesions of oral mucosa: Secondary | ICD-10-CM | POA: Diagnosis not present

## 2023-06-28 DIAGNOSIS — K1379 Other lesions of oral mucosa: Secondary | ICD-10-CM | POA: Diagnosis not present

## 2023-08-08 DIAGNOSIS — R351 Nocturia: Secondary | ICD-10-CM | POA: Diagnosis not present

## 2023-11-01 DIAGNOSIS — K1379 Other lesions of oral mucosa: Secondary | ICD-10-CM | POA: Diagnosis not present

## 2023-11-08 DIAGNOSIS — R351 Nocturia: Secondary | ICD-10-CM | POA: Diagnosis not present

## 2023-11-08 DIAGNOSIS — R350 Frequency of micturition: Secondary | ICD-10-CM | POA: Diagnosis not present

## 2023-11-21 DIAGNOSIS — K1379 Other lesions of oral mucosa: Secondary | ICD-10-CM | POA: Diagnosis not present

## 2023-12-06 DIAGNOSIS — G478 Other sleep disorders: Secondary | ICD-10-CM | POA: Diagnosis not present

## 2023-12-06 DIAGNOSIS — R351 Nocturia: Secondary | ICD-10-CM | POA: Diagnosis not present

## 2024-01-17 DIAGNOSIS — K1379 Other lesions of oral mucosa: Secondary | ICD-10-CM | POA: Diagnosis not present
